# Patient Record
Sex: Male | Born: 1995 | Race: White | Hispanic: No | Marital: Single | Smoking: Current every day smoker
Health system: Southern US, Community
[De-identification: ages and names within clinical notes are randomized; demographics above are authoritative.]

## PROBLEM LIST (undated history)

## (undated) DIAGNOSIS — Z9889 Other specified postprocedural states: Secondary | ICD-10-CM

## (undated) DIAGNOSIS — R011 Cardiac murmur, unspecified: Secondary | ICD-10-CM

## (undated) HISTORY — PX: BACK SURGERY: SHX140

## (undated) HISTORY — DX: Cardiac murmur, unspecified: R01.1

---
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
## 2016-02-28 DIAGNOSIS — F17210 Nicotine dependence, cigarettes, uncomplicated: Secondary | ICD-10-CM | POA: Diagnosis not present

## 2016-02-28 DIAGNOSIS — A6002 Herpesviral infection of other male genital organs: Secondary | ICD-10-CM | POA: Insufficient documentation

## 2016-02-28 DIAGNOSIS — R21 Rash and other nonspecific skin eruption: Secondary | ICD-10-CM | POA: Diagnosis present

## 2016-02-29 ENCOUNTER — Encounter (HOSPITAL_COMMUNITY): Payer: Self-pay

## 2016-02-29 ENCOUNTER — Emergency Department (HOSPITAL_COMMUNITY)
Admission: EM | Admit: 2016-02-29 | Discharge: 2016-02-29 | Disposition: A | Discharge status: Home / Self Care | Payer: Commercial Managed Care - HMO | Attending: Emergency Medicine | Admitting: Emergency Medicine

## 2016-02-29 DIAGNOSIS — B009 Herpesviral infection, unspecified: Secondary | ICD-10-CM

## 2016-02-29 MED ORDER — DOXYCYCLINE HYCLATE 100 MG PO CAPS: 100.0000 mg | ORAL_CAPSULE | Freq: Two times a day (BID) | ORAL | Status: DC

## 2016-02-29 MED ORDER — IBUPROFEN 800 MG PO TABS
800.0000 mg | ORAL_TABLET | Freq: Once | ORAL | Status: AC
  Administered 2016-02-29: 800 mg via ORAL
  Filled 2016-02-29: qty 1

## 2016-02-29 MED ORDER — DOXYCYCLINE HYCLATE 100 MG PO TABS
100.0000 mg | ORAL_TABLET | Freq: Two times a day (BID) | ORAL | Status: DC
  Administered 2016-02-29: 100 mg via ORAL
  Filled 2016-02-29: qty 1

## 2016-02-29 MED ORDER — ONDANSETRON HCL 4 MG PO TABS
4.0000 mg | ORAL_TABLET | Freq: Once | ORAL | Status: AC
  Administered 2016-02-29: 4 mg via ORAL
  Filled 2016-02-29: qty 1

## 2016-02-29 MED ORDER — ACYCLOVIR 200 MG PO CAPS
800.0000 mg | ORAL_CAPSULE | Freq: Once | ORAL | Status: AC
  Administered 2016-02-29: 800 mg via ORAL
  Filled 2016-02-29 (×2): qty 4

## 2016-02-29 MED ORDER — ACYCLOVIR 400 MG PO TABS: 400.0000 mg | ORAL_TABLET | Freq: Three times a day (TID) | ORAL | Status: DC

## 2016-02-29 MED ORDER — ACETAMINOPHEN 325 MG PO TABS
650.0000 mg | ORAL_TABLET | Freq: Once | ORAL | Status: AC
  Administered 2016-02-29: 650 mg via ORAL
  Filled 2016-02-29: qty 2

## 2016-02-29 NOTE — Discharge Instructions (Signed)
Your rash may be related to the blister that you ruptured. Your rash may also be related to condoms, but most likely herpes. Please start acyclovir 3 times daily. Please use doxycycline 2 times daily for possible staph type infection. Use 600 mg of ibuprofen every 6 hours for soreness. May use Tylenol in between those doses if needed for discomfort. The result of your herpes titer should be available within the next 2-3 days. Please refrain from all sexual activity until you received the results of the tests. Herpes Simplex Virus Herpes simplex virus is a viral infection that may infect many different areas of the body, such as the genitalia and mouth. There are two different strains of the virus: herpes simplex virus 1 (HSV-1) and herpes simplex virus 2 (HSV-2). HSV-1 is typically associated with infections of the mouth and lips. HSV-2 is associated with infections of the genitals. However, either strain of the virus may infect any area. HSV may be spread through saliva particles or sexual contact. One unusual form of HSV-1, known as herpes gladiatorum, is passed from skin-to-skin contact, such as in wrestling. SYMPTOMS   Sometimes, no symptoms.  Fever.  Headache.  Muscle aches.  Tingling.  Itching.  Tenderness.  Genital burning feeling.  Genital pain.  Pain with urination.  Pain with sexual intercourse.  Small blisters in the affected areas. RISK FACTORS   Kissing an infected person.  Sharing eating utensils with an infected person.  Unprotected sexual activity.  Multiple sexual partners.  Direct contact sports without protective clothing.  Contact with an exposed herpes sore.  Stress, illness, and cold increase the risk of recurrence. PROGNOSIS  The primary outbreak of an HSV infection usually lasts 2 to 3 weeks. However, it has been known to last up to 6 weeks. After the primary outbreak subsides, the virus goes into a stage known as latency. During this time, there  may be no physical symptoms of infection. After a period of time, some event, such as stress, cold, or illness will trigger another outbreak. This cycle of latency and outbreak may continue indefinitely. The outbreaks usually become milder over time. The body cannot rid itself of HSV. RELATED COMPLICATIONS   Recurrence.  Infection in other areas of the body, such as the eye (ocular herpetic infection, keratitis) and rarely the brain (herpetic encephalitis). TREATMENT  Many HSV infections can be treated without medicine. During an outbreak, avoid touching the sores. Ice may be used to dull the pain and suppress the virus. Exposure to the sun is a common trigger for an outbreak, so the use of sunscreen may help in such cases. Avoid sexual contact during outbreaks. During the latent periods, it is advised that you use latex condoms, which will reduce the likelihood of spreading the virus to another person. Condoms made from animal products do not protect against HSV. Male condoms cover a larger area than male condoms, and may offer the most protection from the transmission of HSV. The presence of HSV will not affect a condom's ability to protect against pregnancy. Only take medicines for pain and discomfort if directed to do so by your caregiver. Many claims exist that certain dietary changes will prevent an outbreak, but these claims have not been proven. These claims include eating foods that are high in L-lysine and low in arginine (i.e. yogurt, beets, apples, pears, mangoes, oily fish (such as salmon, haddock, snapper, and swordfish), soybean sprouts, chicken, and tomatoes).  Athletes may return to play once they are showing no  symptoms, and they have been treated.    This information is not intended to replace advice given to you by your health care provider. Make sure you discuss any questions you have with your health care provider.   Document Released: 11/10/2005 Document Revised: 02/02/2012  Document Reviewed: 05/30/2015 Elsevier Interactive Patient Education [employer]

## 2016-02-29 NOTE — ED Notes (Signed)
Patient verbalizes understanding of discharge instructions, prescription medications, home care and follow up care. Patient out of department at this time. 

## 2016-02-29 NOTE — ED Notes (Signed)
Patient states rash on scrotum X3 days. Patient [facility]. Patient states he has had multiple sex partners, and thinks he may have herpes.

## 2016-02-29 NOTE — ED Provider Notes (Signed)
CSN: [id number]     Arrival date & time 02/28/16  2359 History   First MD Initiated Contact with Patient 02/29/16 0015     Chief Complaint  Patient presents with  . Rash     (Consider location/radiation/quality/duration/timing/severity/associated sxs/prior Treatment) HPI Comments: Patient is an 20 year old male who presents to the emergency department with complaint of rash on the penis and scrotum.  The patient states that he was in a relationship with his current girlfriend, this relationship ended and he started having sexual intercourse with other women, he then came back to be with the current girlfriend and only use protection one time. Shortly after this he noticed a blister type bump on the scrotum. He states that he ruptured it, and after this he began to notice increasing bumps on the penis and the scrotum. He states that the areas are painful and seemed to keep increasing in volume. He has not had any high fever. He's not had any drainage or discharge from the penis. He is not had a change in the type of condom used, and he has no known history of allergy to latex condoms. The patient states he also has a "knot" on the inguinal area that is tender.  It is of note that the patient was seen at the [facility] hospital 2 days ago at which time he was treated with Zithromax and Rocephin, but it the patient states the problems and to be getting worse instead of better.  The history is provided by the patient.    History reviewed. No pertinent past medical history. Past Surgical History  Procedure Laterality Date  . Back surgery     History reviewed. No pertinent family history. Social History  Substance Use Topics  . Smoking status: Current Every Day Smoker -- 0.25 packs/day    Types: Cigarettes  . Smokeless tobacco: Current User    Types: Chew  . Alcohol Use: Yes     Comment: occasionally    Review of Systems  Constitutional: Negative for fever, chills, activity change and  appetite change.       All ROS Neg except as noted in HPI  HENT: Negative for nosebleeds.   Eyes: Negative for photophobia and discharge.  Respiratory: Negative for cough, shortness of breath and wheezing.   Cardiovascular: Negative for chest pain and palpitations.  Gastrointestinal: Negative for abdominal pain and blood in stool.  Genitourinary: Positive for penile pain. Negative for dysuria, frequency and hematuria.  Musculoskeletal: Negative for back pain, arthralgias and neck pain.  Skin: Positive for rash.  Neurological: Negative for dizziness, seizures and speech difficulty.  Psychiatric/Behavioral: Negative for hallucinations and confusion.      Allergies  Review of patient's allergies indicates no known allergies.  Home Medications   Prior to Admission medications   Medication Sig Start Date End Date Taking? Authorizing Provider  ciprofloxacin (CIPRO) 500 MG tablet Take 1 tablet (500 mg total) by mouth 2 (two) times daily. 10/12/15   Henderson Cloud, MD  metroNIDAZOLE (FLAGYL) 500 MG tablet Take 1 tablet (500 mg total) by mouth 3 (three) times daily. 10/12/15   Henderson Cloud, MD  VYVANSE 50 MG capsule Take 50 mg by mouth daily. 10/05/14   Historical Provider, MD   BP 138/86 mmHg  Pulse 94  Temp(Src) 97.9 F (36.6 C) (Oral)  Resp 18  Ht  (1.753 m)  Wt 99.791 kg  BMI 32.47 kg/m2  SpO2 100% Physical Exam  Constitutional: He is oriented to  person, place, and time. He appears well-developed and well-nourished.  Non-toxic appearance.  HENT:  Head: Normocephalic.  Right Ear: Tympanic membrane and external ear normal.  Left Ear: Tympanic membrane and external ear normal.  Eyes: EOM and lids are normal. Pupils are equal, round, and reactive to light.  Neck: Normal range of motion. Neck supple. Carotid bruit is not present.  Cardiovascular: Normal rate, regular rhythm, normal heart sounds, intact distal pulses and normal pulses.   Pulmonary/Chest:  Breath sounds normal. No respiratory distress.  Abdominal: Soft. Bowel sounds are normal. There is no tenderness. There is no guarding.  Genitourinary: Right testis shows no swelling. Left testis shows no swelling. Circumcised. Penile tenderness present. No discharge found.  Patient has multiple lesions about the head of the penis and the underside of the penis that have a red base and blister some of the blisters have ruptured. There are also a few red raised areas on the scrotum. The scrotal area and the penis area are tender to palpation or manipulation. There are palpable small nodes in the inguinal areas on the right and the left. There is no penile drainage or discharge appreciated.  Musculoskeletal: Normal range of motion.  Lymphadenopathy:       Head (right side): No submandibular adenopathy present.       Head (left side): No submandibular adenopathy present.    He has no cervical adenopathy.       Right: Inguinal adenopathy present.       Left: Inguinal adenopathy present.  Neurological: He is alert and oriented to person, place, and time. He has normal strength. No cranial nerve deficit or sensory deficit.  Skin: Skin is warm and dry.  Psychiatric: He has a normal mood and affect. His speech is normal.  Nursing note and vitals reviewed.   ED Course  Procedures (including critical care time) Labs Review Labs Reviewed - No data to display  Imaging Review No results found. I have personally reviewed and evaluated these images and lab results as part of my medical decision-making.   EKG Interpretation None      MDM  Vital signs within normal limits. Discussed with the patient that his rash could be related to the blister that he ruptured, condoms, and or herpes. Will start acyclovir and doxycycline. Discussed with the patient the contagious nature of this illness. Herpes titer drawn. Patient is to follow with his primary physician.    Final diagnoses:  None    *I have  reviewed nursing notes, vital signs, and all appropriate lab and imaging results for this patient.[address]    Mairyn Lenahan, PA-C 02/29/16 [id number]  Rolland PorterMark James, MD 02/29/16 [phone]

## 2016-03-01 LAB — HSV(HERPES SIMPLEX VRS) I + II AB-IGG

## 2018-01-04 ENCOUNTER — Encounter (HOSPITAL_COMMUNITY): Payer: Self-pay | Admitting: Emergency Medicine

## 2018-01-04 ENCOUNTER — Emergency Department (HOSPITAL_COMMUNITY): Payer: 59

## 2018-01-04 ENCOUNTER — Emergency Department (HOSPITAL_COMMUNITY)
Admission: EM | Admit: 2018-01-04 | Discharge: 2018-01-05 | Disposition: A | Discharge status: Home / Self Care | Payer: 59 | Attending: Emergency Medicine | Admitting: Emergency Medicine

## 2018-01-04 ENCOUNTER — Other Ambulatory Visit: Payer: Self-pay

## 2018-01-04 DIAGNOSIS — Z79899 Other long term (current) drug therapy: Secondary | ICD-10-CM | POA: Diagnosis not present

## 2018-01-04 DIAGNOSIS — F17210 Nicotine dependence, cigarettes, uncomplicated: Secondary | ICD-10-CM | POA: Diagnosis not present

## 2018-01-04 DIAGNOSIS — M5432 Sciatica, left side: Secondary | ICD-10-CM | POA: Insufficient documentation

## 2018-01-04 DIAGNOSIS — M5431 Sciatica, right side: Secondary | ICD-10-CM | POA: Diagnosis not present

## 2018-01-04 DIAGNOSIS — M545 Low back pain: Secondary | ICD-10-CM | POA: Diagnosis present

## 2018-01-04 MED ORDER — KETOROLAC TROMETHAMINE 30 MG/ML IJ SOLN
30.0000 mg | Freq: Once | INTRAMUSCULAR | Status: AC
  Administered 2018-01-04: 30 mg via INTRAMUSCULAR
  Filled 2018-01-04: qty 1

## 2018-01-04 MED ORDER — HYDROCODONE-ACETAMINOPHEN 5-325 MG PO TABS
1.0000 | ORAL_TABLET | Freq: Once | ORAL | Status: AC
  Administered 2018-01-04: 1 via ORAL
  Filled 2018-01-04: qty 1

## 2018-01-04 MED ORDER — PREDNISONE 10 MG PO TABS
60.0000 mg | ORAL_TABLET | Freq: Once | ORAL | Status: AC
  Administered 2018-01-04: 60 mg via ORAL
  Filled 2018-01-04: qty 1

## 2018-01-04 NOTE — ED Triage Notes (Signed)
Pt c/o lower back pain that radiates to bilateral buttocks.

## 2018-01-04 NOTE — ED Provider Notes (Signed)
[facility] EMERGENCY DEPARTMENT Provider Note   CSN: [id number] Arrival date & time: 01/04/18  2026     History   Chief Complaint Chief Complaint  Patient presents with  . Back Pain    HPI Gabriel Chapman is a 22 y.o. male who is currently being treated for acute prostatitis with ciprofloxacin presenting with low back pain which started yesterday with a burning sensation radiating to his bilateral mid buttock region. 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  He denies fevers, chills, dysuria, hematuria and has had no urinary or fecal incontinence or retention.  His pain is worsened with certain positions.  He has had no medicine or treatment prior to arrival.  The history is provided by the patient.    History reviewed. No pertinent past medical history.  Patient Active Problem List   Diagnosis Date Noted  . Nausea and vomiting 10/11/2015  . Enteritis 10/11/2015  . ETOH abuse 10/11/2015  . Tobacco abuse 10/11/2015  . Leukocytosis 10/11/2015    Past Surgical History:  Procedure Laterality Date  . BACK SURGERY         Home Medications    Prior to Admission medications   Medication Sig Start Date End Date Taking? Authorizing Provider  acyclovir (ZOVIRAX) 400 MG tablet Take 1 tablet (400 mg total) by mouth 3 (three) times daily. 02/29/16   Ivery Quale, PA-C  ciprofloxacin (CIPRO) 500 MG tablet Take 1 tablet (500 mg total) by mouth 2 (two) times daily. 10/12/15   Philip Aspen, Limmie Patricia, MD  cyclobenzaprine (FLEXERIL) 5 MG tablet Take 1 tablet (5 mg total) by mouth 3 (three) times daily as needed for muscle spasms. 01/05/18   Burgess Amor, PA-C  doxycycline (VIBRAMYCIN) 100 MG capsule Take 1 capsule (100 mg total) by mouth 2 (two) times daily. 02/29/16   Ivery Quale, PA-C  HYDROcodone-acetaminophen (NORCO/VICODIN) 5-325 MG tablet Take 1 tablet by  mouth every 4 (four) hours as needed. 01/05/18   Burgess Amor, PA-C  metroNIDAZOLE (FLAGYL) 500 MG tablet Take 1 tablet (500 mg total) by mouth 3 (three) times daily. 10/12/15   Philip Aspen, Limmie Patricia, MD  predniSONE (DELTASONE) 10 MG tablet Take 6 tablets day one, 5 tablets day two, 4 tablets day three, 3 tablets day four, 2 tablets day five, then 1 tablet day six 01/05/18   Caliph Borowiak, Raynelle Fanning, PA-C  VYVANSE 50 MG capsule Take 50 mg by mouth daily. 10/05/14   [provider]    Family History No family history on file.  Social History Social History   Tobacco Use  . Smoking status: Current Every Day Smoker    Packs/day: 0.25    Types: Cigarettes  . Smokeless tobacco: Current User    Types: Chew  Substance Use Topics  . Alcohol use: Yes    Comment: occasionally  . Drug use: No     Allergies   Patient has no known allergies.   Review of Systems Review of Systems  Constitutional: Negative for fever.  Respiratory: Negative for shortness of breath.   Cardiovascular: Negative for chest pain and leg swelling.  Gastrointestinal: Negative for abdominal distention, abdominal pain and constipation.  Genitourinary: Negative for difficulty urinating, dysuria, flank pain, frequency and urgency.  Musculoskeletal: Positive for back pain. Negative for gait problem and joint swelling.  Skin: Negative for rash.  Neurological: Negative for weakness and numbness.  Physical Exam Updated Vital Signs BP (!) 148/91 (BP Location: Right Arm)   Pulse (!) 103   Temp 98.4 F (36.9 C) (Oral)   Resp 20   Ht 5' 7.5" (1.715 m)   Wt 105.7 kg (233 lb)   SpO2 100%   BMI 35.95 kg/m   Physical Exam  Constitutional: He appears well-developed and well-nourished.  HENT:  Head: Normocephalic.  Eyes: Conjunctivae are normal.  Neck: Normal range of motion. Neck supple.  Cardiovascular: Normal rate and intact distal pulses.  Pedal pulses normal.  Pulmonary/Chest: Effort normal.  Abdominal:  Soft. Bowel sounds are normal. He exhibits no distension and no mass.  Musculoskeletal: Normal range of motion. He exhibits no edema.       Lumbar back: He exhibits bony tenderness. He exhibits no swelling, no edema and no spasm.  Midline lumbar ttp. No palpable deformity, no edema, no erythema.  Neurological: He is alert. He has normal strength. He displays no atrophy and no tremor. No sensory deficit. Gait normal.  Reflex Scores:      Patellar reflexes are 2+ on the right side and 2+ on the left side.      Achilles reflexes are 2+ on the right side and 2+ on the left side. No strength deficit noted in hip and knee flexor and extensor muscle groups.  Ankle flexion and extension intact.  Skin: Skin is warm and dry.  Psychiatric: He has a normal mood and affect.  Nursing note and vitals reviewed.    ED Treatments / Results  Labs (all labs ordered are listed, but only abnormal results are displayed) Labs Reviewed - No data to display  EKG  EKG Interpretation None       Radiology Dg Lumbar Spine Complete  Result Date: 01/04/2018 CLINICAL DATA:  22 y/o M; lower back pain radiating down both legs since yesterday. EXAM: LUMBAR SPINE - COMPLETE 4+ VIEW COMPARISON:  None. FINDINGS: There is no evidence of lumbar spine fracture. Minimal S-shaped curvature of the lumbar spine. Normal lumbar lordosis without listhesis. Intervertebral disc spaces are maintained. IMPRESSION: Minimal S-shaped curvature. Normal lumbar lordosis without listhesis. No acute fracture. Electronically Signed   By: Mitzi Hansen M.D.   On: 01/04/2018 23:26    Procedures Procedures (including critical care time)  Medications Ordered in ED Medications  ketorolac (TORADOL) 30 MG/ML injection 30 mg (30 mg Intramuscular Given 01/04/18 2238)  predniSONE (DELTASONE) tablet 60 mg (60 mg Oral Given 01/04/18 2238)  HYDROcodone-acetaminophen (NORCO/VICODIN) 5-325 MG per tablet 1 tablet (1 tablet Oral Given 01/04/18  2238)     Initial Impression / Assessment and Plan / ED Course  I have reviewed the triage vital signs and the nursing notes.  Pertinent labs & imaging results that were available during my care of the patient were reviewed by me and considered in my medical decision making (see chart for details).     No neuro deficit on exam or by history to suggest emergent or surgical presentation.  Discussed worsened sx that should prompt immediate re-evaluation including distal weakness, bowel/bladder retention/incontinence.        Final Clinical Impressions(s) / ED Diagnoses   Final diagnoses:  Bilateral sciatica    ED Discharge Orders        Ordered    predniSONE (DELTASONE) 10 MG tablet     01/05/18 0005    HYDROcodone-acetaminophen (NORCO/VICODIN) 5-325 MG tablet  Every 4 hours PRN     01/05/18 0005    cyclobenzaprine (FLEXERIL) 5 MG  tablet  3 times daily PRN     01/05/18 0005       Burgess Amordol, Mackenzy Eisenberg, PA-C 01/05/18 0008    Loren RacerYelverton, David, MD 01/05/18 2234

## 2018-01-05 MED ORDER — HYDROCODONE-ACETAMINOPHEN 5-325 MG PO TABS: 1.0000 | ORAL_TABLET | ORAL | 0 refills | Status: DC | PRN

## 2018-01-05 MED ORDER — PREDNISONE 10 MG PO TABS: ORAL_TABLET | ORAL | 0 refills | Status: DC

## 2018-01-05 MED ORDER — CYCLOBENZAPRINE HCL 5 MG PO TABS: 5.0000 mg | ORAL_TABLET | Freq: Three times a day (TID) | ORAL | 0 refills | Status: DC | PRN

## 2018-01-05 NOTE — Discharge Instructions (Signed)
Take your next dose of prednisone tomorrow evening.  Use the the other medicines as directed.  Do not drive within 4 hours of taking hydrocodone as this will make you drowsy.  Avoid lifting,  Bending,  Twisting or any other activity that worsens your pain over the next week.  Apply a heating pad to your lower back for 20 minutes several times daily.   You should get rechecked if your symptoms are not better over the next 5 days,  Or you develop increased pain,  Weakness in your leg(s) or loss of bladder or bowel function - these can be symptoms of a worsening condition as discussed.

## 2018-01-21 ENCOUNTER — Encounter: Payer: Self-pay | Admitting: Cardiology

## 2018-01-21 ENCOUNTER — Telehealth: Payer: Self-pay | Admitting: Cardiology

## 2018-01-21 ENCOUNTER — Ambulatory Visit: Payer: 59 | Admitting: Cardiology

## 2018-01-21 ENCOUNTER — Other Ambulatory Visit: Payer: Self-pay

## 2018-01-21 VITALS — BP 127/75 | HR 78 | Ht 67.5 in | Wt 235.0 lb

## 2018-01-21 DIAGNOSIS — R011 Cardiac murmur, unspecified: Secondary | ICD-10-CM

## 2018-01-21 DIAGNOSIS — R002 Palpitations: Secondary | ICD-10-CM | POA: Diagnosis not present

## 2018-01-21 NOTE — Progress Notes (Addendum)
Clinical Summary Gabriel Chapman is a 21 y.o.male seen as new consult, referred by Dr Beverely Pace for hear tmurmur  1. Heart murmur - recently detected by pcp - no SOB/DOE. Very active in high school sports growing up without limitation - no recent edema. No chest pain.   2. Irregular heart beat - palpitations, mainly feels pounding in neck area. Occurs few times a week. Lasts a few seconds. No other associated symptoms - rare coffee, diet pepsi x2-3, sweet tea occasionally, no energy drinks, occasoinal beer  Denies any past medical history   No Known Allergies   Current Outpatient Medications  Medication Sig Dispense Refill  . acyclovir (ZOVIRAX) 400 MG tablet Take 1 tablet (400 mg total) by mouth 3 (three) times daily. 30 tablet 0  . ciprofloxacin (CIPRO) 500 MG tablet Take 1 tablet (500 mg total) by mouth 2 (two) times daily. 28 tablet 0  . cyclobenzaprine (FLEXERIL) 5 MG tablet Take 1 tablet (5 mg total) by mouth 3 (three) times daily as needed for muscle spasms. 15 tablet 0  . doxycycline (VIBRAMYCIN) 100 MG capsule Take 1 capsule (100 mg total) by mouth 2 (two) times daily. 14 capsule 0  . HYDROcodone-acetaminophen (NORCO/VICODIN) 5-325 MG tablet Take 1 tablet by mouth every 4 (four) hours as needed. 10 tablet 0  . metroNIDAZOLE (FLAGYL) 500 MG tablet Take 1 tablet (500 mg total) by mouth 3 (three) times daily. 42 tablet 0  . predniSONE (DELTASONE) 10 MG tablet Take 6 tablets day one, 5 tablets day two, 4 tablets day three, 3 tablets day four, 2 tablets day five, then 1 tablet day six 21 tablet 0  . VYVANSE 50 MG capsule Take 50 mg by mouth daily.     No current facility-administered medications for this visit.      Past Surgical History:  Procedure Laterality Date  . BACK SURGERY       No Known Allergies    No family history on file.   Social History Gabriel Chapman reports that he has been smoking cigarettes.  He has been smoking about 0.25 packs per day. His  smokeless tobacco use includes chew. Gabriel Chapman reports that he drinks alcohol.   Review of Systems CONSTITUTIONAL: No weight loss, fever, chills, weakness or fatigue.  HEENT: Eyes: No visual loss, blurred vision, double vision or yellow sclerae.No hearing loss, sneezing, congestion, runny nose or sore throat.  SKIN: No rash or itching.  CARDIOVASCULAR: per hpi RESPIRATORY: No shortness of breath, cough or sputum.  GASTROINTESTINAL: No anorexia, nausea, vomiting or diarrhea. No abdominal pain or blood.  GENITOURINARY: No burning on urination, no polyuria NEUROLOGICAL: No headache, dizziness, syncope, paralysis, ataxia, numbness or tingling in the extremities. No change in bowel or bladder control.  MUSCULOSKELETAL: No muscle, back pain, joint pain or stiffness.  LYMPHATICS: No enlarged nodes. No history of splenectomy.  PSYCHIATRIC: No history of depression or anxiety.  ENDOCRINOLOGIC: No reports of sweating, cold or heat intolerance. No polyuria or polydipsia.  Marland Kitchen   Physical Examination Vitals:   01/21/18 0841  BP: 127/75  Pulse: 78  SpO2: 99%   Vitals:   01/21/18 0841  Weight: 235 lb (106.6 kg)  Height: 5' 7.5" (1.715 m)    Gen: resting comfortably, no acute distress HEENT: no scleral icterus, pupils equal round and reactive, no palptable cervical adenopathy,  CV: RRR, 2/6 systolic murmur rusb, no jvd Resp: Clear to auscultation bilaterally GI: abdomen is soft, non-tender, non-distended, normal bowel sounds, no hepatosplenomegaly  MSK: extremities are warm, no edema.  Skin: warm, no rash Neuro:  no focal deficits Psych: appropriate affect   Assessment and Plan  1. Heart murmur - obtain echo to further evaluate  2. Palpitations EKG today shows SR. Prior EKGs have shown occasional PVCs - wean caffeine and monitor symptoms, f/u echo  F/u pending test results   Antoine PocheJonathan F. Russie Gulledge, M.D.

## 2018-01-21 NOTE — Patient Instructions (Signed)

## 2018-01-21 NOTE — Telephone Encounter (Signed)
Pre-cert Verification for the following procedure   Echo scheduled for 02-03-2018

## 2018-01-26 ENCOUNTER — Telehealth: Payer: Self-pay | Admitting: *Deleted

## 2018-01-26 ENCOUNTER — Encounter: Payer: Self-pay | Admitting: Cardiology

## 2018-01-26 NOTE — Telephone Encounter (Signed)
Pt is scheduled to start a new job next week and they will not allow him to start w/o clearance from Dr Wyline MoodBranch - pt is scheduled for echo on 3/13 - can we clear him now or wait until echo is resulted?

## 2018-01-27 ENCOUNTER — Encounter: Payer: Self-pay | Admitting: *Deleted

## 2018-01-27 NOTE — Telephone Encounter (Signed)
He is ok to start work. There is nothing from a cardiac standpoint we are worried about that would keep him from working   Dominga FerryJ Saranda Legrande MD

## 2018-01-27 NOTE — Telephone Encounter (Signed)
Pt aware and appreciative. [facility].

## 2018-02-03 ENCOUNTER — Other Ambulatory Visit: Payer: 59

## 2018-02-03 DIAGNOSIS — R0989 Other specified symptoms and signs involving the circulatory and respiratory systems: Secondary | ICD-10-CM

## 2018-03-03 ENCOUNTER — Encounter: Payer: Self-pay | Admitting: Cardiology

## 2018-09-04 ENCOUNTER — Emergency Department (HOSPITAL_COMMUNITY)
Admission: EM | Admit: 2018-09-04 | Discharge: 2018-09-04 | Disposition: A | Discharge status: Home / Self Care | Payer: 59 | Attending: Emergency Medicine | Admitting: Emergency Medicine

## 2018-09-04 ENCOUNTER — Encounter (HOSPITAL_COMMUNITY): Payer: Self-pay | Admitting: Emergency Medicine

## 2018-09-04 ENCOUNTER — Other Ambulatory Visit: Payer: Self-pay

## 2018-09-04 DIAGNOSIS — K529 Noninfective gastroenteritis and colitis, unspecified: Secondary | ICD-10-CM | POA: Insufficient documentation

## 2018-09-04 DIAGNOSIS — F17220 Nicotine dependence, chewing tobacco, uncomplicated: Secondary | ICD-10-CM | POA: Insufficient documentation

## 2018-09-04 DIAGNOSIS — R112 Nausea with vomiting, unspecified: Secondary | ICD-10-CM | POA: Diagnosis present

## 2018-09-04 DIAGNOSIS — F17210 Nicotine dependence, cigarettes, uncomplicated: Secondary | ICD-10-CM | POA: Insufficient documentation

## 2018-09-04 LAB — URINALYSIS, ROUTINE W REFLEX MICROSCOPIC
BILIRUBIN URINE: NEGATIVE
Bacteria, UA: NONE SEEN
Glucose, UA: NEGATIVE mg/dL
Hgb urine dipstick: NEGATIVE
Ketones, ur: NEGATIVE mg/dL
LEUKOCYTES UA: NEGATIVE
NITRITE: NEGATIVE
PH: 6 (ref 5.0–8.0)
Protein, ur: 30 mg/dL — AB
Specific Gravity, Urine: 1.021 (ref 1.005–1.030)

## 2018-09-04 LAB — CBC
HCT: 49 % (ref 39.0–52.0)
Hemoglobin: 17.1 g/dL — ABNORMAL HIGH (ref 13.0–17.0)
MCH: 32.1 pg (ref 26.0–34.0)
MCHC: 34.9 g/dL (ref 30.0–36.0)
MCV: 92.1 fL (ref 80.0–100.0)
NRBC: 0 % (ref 0.0–0.2)
Platelets: 272 10*3/uL (ref 150–400)
RBC: 5.32 MIL/uL (ref 4.22–5.81)
RDW: 11.9 % (ref 11.5–15.5)
WBC: 15.1 10*3/uL — AB (ref 4.0–10.5)

## 2018-09-04 LAB — COMPREHENSIVE METABOLIC PANEL
ALK PHOS: 67 U/L (ref 38–126)
ALT: 40 U/L (ref 0–44)
AST: 25 U/L (ref 15–41)
Albumin: 5.6 g/dL — ABNORMAL HIGH (ref 3.5–5.0)
Anion gap: 11 (ref 5–15)
BUN: 10 mg/dL (ref 6–20)
CALCIUM: 9.8 mg/dL (ref 8.9–10.3)
CO2: 22 mmol/L (ref 22–32)
CREATININE: 0.66 mg/dL (ref 0.61–1.24)
Chloride: 106 mmol/L (ref 98–111)
Glucose, Bld: 111 mg/dL — ABNORMAL HIGH (ref 70–99)
Potassium: 3.9 mmol/L (ref 3.5–5.1)
Sodium: 139 mmol/L (ref 135–145)
Total Bilirubin: 1.1 mg/dL (ref 0.3–1.2)
Total Protein: 8.6 g/dL — ABNORMAL HIGH (ref 6.5–8.1)

## 2018-09-04 LAB — LIPASE, BLOOD: Lipase: 22 U/L (ref 11–51)

## 2018-09-04 MED ORDER — ONDANSETRON HCL 8 MG PO TABS: 8.0000 mg | ORAL_TABLET | ORAL | 0 refills | Status: DC | PRN

## 2018-09-04 MED ORDER — ACETAMINOPHEN 500 MG PO TABS
1000.0000 mg | ORAL_TABLET | Freq: Once | ORAL | Status: AC
  Administered 2018-09-04: 1000 mg via ORAL
  Filled 2018-09-04: qty 2

## 2018-09-04 MED ORDER — SODIUM CHLORIDE 0.9 % IV BOLUS
1000.0000 mL | Freq: Once | INTRAVENOUS | Status: AC
  Administered 2018-09-04: 1000 mL via INTRAVENOUS

## 2018-09-04 MED ORDER — FAMOTIDINE IN NACL 20-0.9 MG/50ML-% IV SOLN
20.0000 mg | Freq: Once | INTRAVENOUS | Status: AC
  Administered 2018-09-04: 20 mg via INTRAVENOUS
  Filled 2018-09-04: qty 50

## 2018-09-04 MED ORDER — ONDANSETRON HCL 4 MG/2ML IJ SOLN
4.0000 mg | Freq: Once | INTRAMUSCULAR | Status: AC
  Administered 2018-09-04: 4 mg via INTRAVENOUS
  Filled 2018-09-04: qty 2

## 2018-09-04 NOTE — Discharge Instructions (Addendum)
Prescription for nausea medication.  Tylenol for headache.  Clear liquids today.  Rest.  Return if worse.

## 2018-09-04 NOTE — ED Triage Notes (Signed)
Emesis since last night with small episodes of D/. No OTC medications. No sick contacts.

## 2018-09-04 NOTE — ED Provider Notes (Signed)
[facility] EMERGENCY DEPARTMENT Provider Note   CSN: [id number] Arrival date & time: 09/04/18  1055     History   Chief Complaint Chief Complaint  Patient presents with  . Emesis    HPI Gabriel Chapman is a 22 y.o. male.  Multiple episodes of nausea and vomiting with some diarrhea since midnight.  Patient feels dehydrated.  He is normally healthy.  He states "2 beers" last night.  No fever or chills or bloody stool.  Severity of symptoms is moderate.  Nothing makes symptoms better or worse.     Past Medical History:  Diagnosis Date  . Heart murmur     Patient Active Problem List   Diagnosis Date Noted  . Nausea and vomiting 10/11/2015  . Enteritis 10/11/2015  . ETOH abuse 10/11/2015  . Tobacco abuse 10/11/2015  . Leukocytosis 10/11/2015    Past Surgical History:  Procedure Laterality Date  . BACK SURGERY          Home Medications    Prior to Admission medications   Medication Sig Start Date End Date Taking? Authorizing Provider  cyclobenzaprine (FLEXERIL) 5 MG tablet Take 1 tablet (5 mg total) by mouth 3 (three) times daily as needed for muscle spasms. 01/05/18   Burgess Amor, PA-C  ondansetron (ZOFRAN) 8 MG tablet Take 1 tablet (8 mg total) by mouth every 4 (four) hours as needed. 09/04/18   Donnetta Hutching, MD  predniSONE (DELTASONE) 10 MG tablet Take 6 tablets day one, 5 tablets day two, 4 tablets day three, 3 tablets day four, 2 tablets day five, then 1 tablet day six 01/05/18   Burgess Amor, PA-C    Family History History reviewed. No pertinent family history.  Social History Social History   Tobacco Use  . Smoking status: Current Every Day Smoker    Packs/day: 0.25    Types: Cigarettes  . Smokeless tobacco: Current User    Types: Chew  Substance Use Topics  . Alcohol use: Yes    Comment: occasionally  . Drug use: No     Allergies   Patient has no known allergies.   Review of Systems Review of Systems  All other systems reviewed and are  negative.    Physical Exam Updated Vital Signs BP 125/75 (BP Location: Left Arm)   Pulse 94   Temp 98.2 F (36.8 C) (Oral)   Resp 16   Ht 5\' 8"  (1.727 m)   Wt 111.1 kg   SpO2 97%   BMI 37.25 kg/m   Physical Exam  Constitutional: He is oriented to person, place, and time.  Dry mucous membranes.  HENT:  Head: Normocephalic and atraumatic.  Eyes: Conjunctivae are normal.  Neck: Neck supple.  Cardiovascular: Normal rate and regular rhythm.  Pulmonary/Chest: Effort normal and breath sounds normal.  Abdominal: Soft. Bowel sounds are normal.  Musculoskeletal: Normal range of motion.  Neurological: He is alert and oriented to person, place, and time.  Skin: Skin is warm and dry.  Psychiatric: He has a normal mood and affect. His behavior is normal.  Nursing note and vitals reviewed.    ED Treatments / Results  Labs (all labs ordered are listed, but only abnormal results are displayed) Labs Reviewed  COMPREHENSIVE METABOLIC PANEL - Abnormal; Notable for the following components:      Result Value   Glucose, Bld 111 (*)    Total Protein 8.6 (*)    Albumin 5.6 (*)    All other components within normal limits  CBC - Abnormal; Notable for the following components:   WBC 15.1 (*)    Hemoglobin 17.1 (*)    All other components within normal limits  URINALYSIS, ROUTINE W REFLEX MICROSCOPIC - Abnormal; Notable for the following components:   Protein, ur 30 (*)    All other components within normal limits  LIPASE, BLOOD    EKG None  Radiology No results found.  Procedures Procedures (including critical care time)  Medications Ordered in ED Medications  sodium chloride 0.9 % bolus 1,000 mL ( Intravenous Stopped 09/04/18 1347)  ondansetron (ZOFRAN) injection 4 mg (4 mg Intravenous Given 09/04/18 1156)  famotidine (PEPCID) IVPB 20 mg premix ( Intravenous Stopped 09/04/18 1225)  sodium chloride 0.9 % bolus 1,000 mL ( Intravenous Stopped 09/04/18 1307)  acetaminophen  (TYLENOL) tablet 1,000 mg (1,000 mg Oral Given 09/04/18 1347)     Initial Impression / Assessment and Plan / ED Course  I have reviewed the triage vital signs and the nursing notes.  Pertinent labs & imaging results that were available during my care of the patient were reviewed by me and considered in my medical decision making (see chart for details).     History and physical most consistent with gastroenteritis.  White count minimally elevated, but liver functions and lipase are normal.  Patient feels much better after IV fluids, IV Zofran, IV Pepcid.  Discharge medications Zofran 8 mg  Final Clinical Impressions(s) / ED Diagnoses   Final diagnoses:  Gastroenteritis    ED Discharge Orders         Ordered    ondansetron (ZOFRAN) 8 MG tablet  Every 4 hours PRN,   Status:  Discontinued     09/04/18 1350    ondansetron (ZOFRAN) 8 MG tablet  Every 4 hours PRN     09/04/18 1351           Donnetta Hutching, MD 09/04/18 1514

## 2018-09-04 NOTE — ED Notes (Signed)
EDP at bedside  

## 2018-09-16 DIAGNOSIS — K219 Gastro-esophageal reflux disease without esophagitis: Secondary | ICD-10-CM | POA: Diagnosis not present

## 2018-10-25 ENCOUNTER — Emergency Department (HOSPITAL_COMMUNITY)
Admission: EM | Admit: 2018-10-25 | Discharge: 2018-10-25 | Disposition: A | Discharge status: Home / Self Care | Payer: 59 | Attending: Emergency Medicine | Admitting: Emergency Medicine

## 2018-10-25 ENCOUNTER — Other Ambulatory Visit: Payer: Self-pay

## 2018-10-25 ENCOUNTER — Encounter (HOSPITAL_COMMUNITY): Payer: Self-pay

## 2018-10-25 DIAGNOSIS — F17210 Nicotine dependence, cigarettes, uncomplicated: Secondary | ICD-10-CM | POA: Diagnosis not present

## 2018-10-25 DIAGNOSIS — F1410 Cocaine abuse, uncomplicated: Secondary | ICD-10-CM | POA: Diagnosis not present

## 2018-10-25 DIAGNOSIS — F322 Major depressive disorder, single episode, severe without psychotic features: Secondary | ICD-10-CM | POA: Insufficient documentation

## 2018-10-25 DIAGNOSIS — R45851 Suicidal ideations: Secondary | ICD-10-CM | POA: Insufficient documentation

## 2018-10-25 DIAGNOSIS — F1210 Cannabis abuse, uncomplicated: Secondary | ICD-10-CM | POA: Insufficient documentation

## 2018-10-25 DIAGNOSIS — F32A Depression, unspecified: Secondary | ICD-10-CM

## 2018-10-25 DIAGNOSIS — Z79899 Other long term (current) drug therapy: Secondary | ICD-10-CM | POA: Diagnosis not present

## 2018-10-25 DIAGNOSIS — F329 Major depressive disorder, single episode, unspecified: Secondary | ICD-10-CM | POA: Diagnosis present

## 2018-10-25 LAB — RAPID URINE DRUG SCREEN, HOSP PERFORMED
AMPHETAMINES: NOT DETECTED
BARBITURATES: NOT DETECTED
BENZODIAZEPINES: POSITIVE — AB
Cocaine: POSITIVE — AB
Opiates: NOT DETECTED
TETRAHYDROCANNABINOL: POSITIVE — AB

## 2018-10-25 LAB — COMPREHENSIVE METABOLIC PANEL
ALT: 29 U/L (ref 0–44)
ANION GAP: 9 (ref 5–15)
AST: 16 U/L (ref 15–41)
Albumin: 4.7 g/dL (ref 3.5–5.0)
Alkaline Phosphatase: 71 U/L (ref 38–126)
BUN: 11 mg/dL (ref 6–20)
CHLORIDE: 109 mmol/L (ref 98–111)
CO2: 23 mmol/L (ref 22–32)
Calcium: 9.7 mg/dL (ref 8.9–10.3)
Creatinine, Ser: 0.73 mg/dL (ref 0.61–1.24)
GFR calc Af Amer: >60 mL/min (ref >60)
Glucose, Bld: 132 mg/dL — ABNORMAL HIGH (ref 70–99)
POTASSIUM: 3.6 mmol/L (ref 3.5–5.1)
SODIUM: 141 mmol/L (ref 135–145)
Total Bilirubin: 0.5 mg/dL (ref 0.3–1.2)
Total Protein: 7.5 g/dL (ref 6.5–8.1)

## 2018-10-25 LAB — CBC WITH DIFFERENTIAL/PLATELET
ABS IMMATURE GRANULOCYTES: 0.04 10*3/uL (ref 0.00–0.07)
BASOS PCT: 1 %
Basophils Absolute: 0.1 10*3/uL (ref 0.0–0.1)
Eosinophils Absolute: 0.6 10*3/uL — ABNORMAL HIGH (ref 0.0–0.5)
Eosinophils Relative: 5 %
HCT: 46.2 % (ref 39.0–52.0)
Hemoglobin: 15.9 g/dL (ref 13.0–17.0)
IMMATURE GRANULOCYTES: 0 %
LYMPHS PCT: 26 %
Lymphs Abs: 3.2 10*3/uL (ref 0.7–4.0)
MCH: 31.3 pg (ref 26.0–34.0)
MCHC: 34.4 g/dL (ref 30.0–36.0)
MCV: 90.9 fL (ref 80.0–100.0)
Monocytes Absolute: 0.7 10*3/uL (ref 0.1–1.0)
Monocytes Relative: 6 %
NEUTROS ABS: 7.5 10*3/uL (ref 1.7–7.7)
NEUTROS PCT: 62 %
NRBC: 0 % (ref 0.0–0.2)
PLATELETS: 288 10*3/uL (ref 150–400)
RBC: 5.08 MIL/uL (ref 4.22–5.81)
RDW: 11.7 % (ref 11.5–15.5)
WBC: 12.1 10*3/uL — AB (ref 4.0–10.5)

## 2018-10-25 LAB — ETHANOL

## 2018-10-25 MED ORDER — ACETAMINOPHEN 325 MG PO TABS: 650.0000 mg | ORAL_TABLET | ORAL | Status: DC | PRN

## 2018-10-25 MED ORDER — ALUM & MAG HYDROXIDE-SIMETH 200-200-20 MG/5ML PO SUSP: 30.0000 mL | Freq: Four times a day (QID) | ORAL | Status: DC | PRN

## 2018-10-25 MED ORDER — ZOLPIDEM TARTRATE 5 MG PO TABS: 5.0000 mg | ORAL_TABLET | Freq: Every evening | ORAL | Status: DC | PRN

## 2018-10-25 MED ORDER — ONDANSETRON HCL 4 MG PO TABS: 4.0000 mg | ORAL_TABLET | Freq: Three times a day (TID) | ORAL | Status: DC | PRN

## 2018-10-25 MED ORDER — NICOTINE 7 MG/24HR TD PT24
7.0000 mg | MEDICATED_PATCH | Freq: Every day | TRANSDERMAL | Status: DC
  Administered 2018-10-25: 7 mg via TRANSDERMAL
  Filled 2018-10-25 (×3): qty 1

## 2018-10-25 NOTE — ED Provider Notes (Signed)
[facility] EMERGENCY DEPARTMENT Provider Note   CSN: [id number] Arrival date & time: 10/25/18  0341     History   Chief Complaint No chief complaint on file.   HPI AVERI CACIOPPO is a 22 y.o. male.  The history is provided by the patient and the police.  He was brought in by police because involuntary commitment papers were filed by his father.  His father reports that patient put threats to kill his girlfriend and then kill himself on social media.  Patient denies this.  He does admit to depression and admits to crying spells, early morning wakening, and anhedonia.  He admits to alcohol use and admits to 5 or 6 beers tonight.  He also had taken a Xanax tablet several days ago and smokes marijuana occasionally.  He denies suicidal ideation and homicidal ideation.  He denies hallucinations.  Past Medical History:  Diagnosis Date  . Heart murmur     Patient Active Problem List   Diagnosis Date Noted  . Nausea and vomiting 10/11/2015  . Enteritis 10/11/2015  . ETOH abuse 10/11/2015  . Tobacco abuse 10/11/2015  . Leukocytosis 10/11/2015    Past Surgical History:  Procedure Laterality Date  . BACK SURGERY          Home Medications    Prior to Admission medications   Medication Sig Start Date End Date Taking? Authorizing Provider  cyclobenzaprine (FLEXERIL) 5 MG tablet Take 1 tablet (5 mg total) by mouth 3 (three) times daily as needed for muscle spasms. 01/05/18   Burgess Amor, PA-C  ondansetron (ZOFRAN) 8 MG tablet Take 1 tablet (8 mg total) by mouth every 4 (four) hours as needed. 09/04/18   Donnetta Hutching, MD  predniSONE (DELTASONE) 10 MG tablet Take 6 tablets day one, 5 tablets day two, 4 tablets day three, 3 tablets day four, 2 tablets day five, then 1 tablet day six 01/05/18   Burgess Amor, PA-C    Family History No family history on file.  Social History Social History   Tobacco Use  . Smoking status: Current Every Day Smoker    Packs/day: 0.25    Types:  Cigarettes  . Smokeless tobacco: Current User    Types: Chew  Substance Use Topics  . Alcohol use: Yes    Comment: occasionally  . Drug use: No     Allergies   Patient has no known allergies.   Review of Systems Review of Systems  All other systems reviewed and are negative.    Physical Exam Updated Vital Signs BP (!) 142/93   Pulse (!) 121   Temp 98.9 F (37.2 C) (Oral)   Resp 18   Ht 5\' 8"  (1.727 m)   Wt 113.4 kg   SpO2 98%   BMI 38.01 kg/m   Physical Exam  Nursing note and vitals reviewed.  22 year old male, resting comfortably and in no acute distress. Vital signs are significant for elevated heart rate and blood pressure. Oxygen saturation is 98%, which is normal. Head is normocephalic and atraumatic. PERRLA, EOMI. Oropharynx is clear. Neck is nontender and supple without adenopathy or JVD. Back is nontender and there is no CVA tenderness. Lungs are clear without rales, wheezes, or rhonchi. Chest is nontender. Heart has regular rate and rhythm without murmur. Abdomen is soft, flat, nontender without masses or hepatosplenomegaly and peristalsis is normoactive. Extremities have no cyanosis or edema, full range of motion is present. Skin is warm and dry without rash. Neurologic: Mental status  is normal, cranial nerves are intact, there are no motor or sensory deficits.  ED Treatments / Results  Labs (all labs ordered are listed, but only abnormal results are displayed) Labs Reviewed  RAPID URINE DRUG SCREEN, HOSP PERFORMED - Abnormal; Notable for the following components:      Result Value   Cocaine POSITIVE (*)    Benzodiazepines POSITIVE (*)    Tetrahydrocannabinol POSITIVE (*)    All other components within normal limits  COMPREHENSIVE METABOLIC PANEL - Abnormal; Notable for the following components:   Glucose, Bld 132 (*)    All other components within normal limits  CBC WITH DIFFERENTIAL/PLATELET - Abnormal; Notable for the following components:    WBC 12.1 (*)    Eosinophils Absolute 0.6 (*)    All other components within normal limits  ETHANOL   Procedures Procedures   Medications Ordered in ED Medications - No data to display   Initial Impression / Assessment and Plan / ED Course  I have reviewed the triage vital signs and the nursing notes.  Pertinent labs & imaging results that were available during my care of the patient were reviewed by me and considered in my medical decision making (see chart for details).  Symptoms of depression, but patient denying suicidal or homicidal ideation.  Old records are reviewed, and he has no relevant past visits.  Patient states that his father was doing what he could to break up the relationship between patient and his girlfriend.  Will check screening labs and have patient evaluated by TTS.  Labs are unremarkable.  Drug screen is positive for benzodiazepines, consistent with his stated use of alprazolam.  Drug screen also positive for cocaine and marijuana.  TTS evaluation has been completed, and they are recommending he stay for evaluation by psychiatrist later today.  Final Clinical Impressions(s) / ED Diagnoses   Final diagnoses:  Depression, unspecified depression type  Cocaine abuse [facility](HCC)  Marijuana abuse    ED Discharge Orders    None       Dione BoozeGlick, [place], MD 10/25/18 [phone]

## 2018-10-25 NOTE — ED Notes (Signed)
PT called for ride at discharge. His girlfriend is on the way to pick him up.

## 2018-10-25 NOTE — ED Notes (Signed)
Placed breakfast tray on bedside table. Pt is resting at this time.

## 2018-10-25 NOTE — ED Notes (Signed)
Pt wanded by security prior to triage.

## 2018-10-25 NOTE — Consult Note (Addendum)
                            Tele-psych consultation  Patient is seen and chart is reviewed. The patient continues to deny all information contained in the IVC. He states "I have been depressed. But I broke up with my girlfriend. I used some xanax to cope but it did not help. I was down but getting over it.  I never said I wanted to hurt myself or her. My father has mental illness and just gets ideas in his head. I don't feel the need to come into the hospital. I'm ready to go home." Gabriel Chapman appeared sleepy during the assessment but denied any suicidal or homicidal ideation several times. He denies have a mental health history but reports Bipolar history in his father. Patient based on today's assessment does not meet criteria for inpatient psychiatric admission. His IVC may be rescinded to facilitate discharge.  Gabriel Chapman PMHNP-C  10/25/2018 11:50 am

## 2018-10-25 NOTE — ED Notes (Signed)
Pt verbally understands his IVC condition and RN signed off on [employer] to leave [facility]. Sitetr at bedside and Pts belongings secured in locker.

## 2018-10-25 NOTE — ED Triage Notes (Signed)
Pt brought in with IVC papers taken out by his Dad for danger to self and others. Pt says his Dad has been telling him that if he tries to work things out with his girlfriend he would have him committed. Per IVC papers Dad reports pt put on social media threats to kill his girlfriend and then kill himself. Pt denies this.  Officer meyers reports pt was sleeping when they arrived to pick him up at girlfriend's house tonight. Girlfriend and her kids were reported sleeping as well. Pt reports having 5 or six beers last night.

## 2018-10-25 NOTE — BH Assessment (Addendum)
Tele Assessment Note   Patient Name: Gabriel Chapman MRN: [id number] Referring Physician: Ardith Dark, MD Location of Patient: [facility] ED, [phone] Location of Provider: Behavioral Health TTS Department  Gabriel Chapman is an 22 y.o. single male who presents unaccompanied to [facility] ED via [facility] after Pt's father petitioned for involuntary commitment. Per IVC papers, Pt's father reports Pt put on social media threats to kill his girlfriend and then kill himself. Pt denies this. Pt says his father "hates" his girlfriend. Officer Gabriel Chapman reports pt was sleeping when they arrived to pick him up at girlfriend's house tonight. Girlfriend and her kids were reported sleeping as well. Pt says he and his girlfriend have broken up and "my heart is destroyed."   Pt acknowledges depressive symptoms related to breaking up with his girlfriend. Pt acknowledges symptoms including crying spells, social withdrawal and irritability. He states he has "a problem with anger" and he screams, yells and hits objects when upset. He denies assaulting people. Pt says he has anxiety and panic attacks. He says he has taken Xanax given to him by someone else and wants to ask a physician for a prescription. Pt denies current suicidal ideation. He reports one previous suicide attempt as a child by jumping from second story balcony in response to his parent's divorce. Pt reports he was psychiatrically hospitalized and went to therapy following that incident. He denies current homicidal ideation or history of violence. He denies any history of psychotic symptoms. Pt reports he does use alcohol, cocaine and marijuana (see below for details of use).  Pt identifies breakup with his girlfriend as his primary stressor. He states he is currently living with a friend. He says he is currently unemployed but has a job pending next week. He has no current outpatient mental health providers.  Pt is dressed in hospital scrubs. He  is drowsy and dozed off several times during assessment. He is oriented x4. Pt speaks in a clear tone, at moderate volume and normal pace. Motor behavior appears normal. Eye contact is good. Pt's mood is euthymic and affect is congruent with mood. Thought process is coherent and relevant. There is no indication Pt is currently responding to internal stimuli or experiencing delusional thought content. Pt was calm and cooperative throughout assessment. He states he doesn't believe he need mental health treatment.  Diagnosis:  F32.2 Major depressive disorder, Single episode, Severe  Past Medical History:  Past Medical History:  Diagnosis Date  . Heart murmur     Past Surgical History:  Procedure Laterality Date  . BACK SURGERY      Family History: No family history on file.  Social History:  reports that he has been smoking cigarettes. He has been smoking about 0.25 packs per day. His smokeless tobacco use includes chew. He reports that he drinks about 5.0 - 6.0 standard drinks of alcohol per week. He reports that he has current or past drug history. Drug: Marijuana.  Additional Social History:  Alcohol / Drug Use Pain Medications: Pt denies Prescriptions: Pt denies Over the Counter: Pt denies History of alcohol / drug use?: Yes Longest period of sobriety (when/how long): Several months Negative Consequences of Use: Legal, Personal relationships Withdrawal Symptoms: (Pt denies) Substance #1 Name of Substance 1: Alcohol 1 - Age of First Use: Adolescent 1 - Amount (size/oz): 8-10 beers 1 - Frequency: On weekends 1 - Duration: Ongoing  1 - Last Use / Amount: 10/24/18 Substance #2 Name of Substance 2: Cocaine (powder)  2 - Age of First Use: 19 2 - Amount (size/oz): Varies 2 - Frequency: 2-3 times per year 2 - Duration: Ongoing 2 - Last Use / Amount: 10/23/18 Substance #3 Name of Substance 3: Marijuana 3 - Age of First Use: Adolescent 3 - Amount (size/oz): Small amount 3 -  Frequency: 2-3 times per year 3 - Duration: Ongoing 3 - Last Use / Amount: 10/23/18 Substance #4 Name of Substance 4: Xanax 4 - Age of First Use: 21 4 - Amount (size/oz): 0.5-1 mg 4 - Frequency: 1-2 times per year 4 - Duration: Ongoing 4 - Last Use / Amount: 10/24/18  CIWA: CIWA-Ar BP: (!) 142/93 Pulse Rate: (!) 121 COWS:    Allergies: No Known Allergies  Home Medications:  (Not in a hospital admission)  OB/GYN Status:  No LMP for male patient.  General Assessment Data Location of Assessment: [facility] TTS Assessment: In system Is this a Tele or Face-to-Face Assessment?: Tele Assessment Is this an Initial Assessment or a Re-assessment for this encounter?: Initial Assessment Patient Accompanied by:: N/A(Alone) Language Other than English: No Living Arrangements: Other (Comment) What gender do you identify as?: Male Marital status: Single Maiden name: NA Pregnancy Status: No Living Arrangements: Non-relatives/Friends Can pt return to current living arrangement?: Yes Admission Status: Involuntary Petitioner: Family member Is patient capable of signing voluntary admission?: Yes Referral Source: Self/Family/Friend Insurance type: Engineer, [employer]     Crisis Care Plan Living Arrangements: Non-relatives/Friends Legal Guardian: Other:(Self) Name of Psychiatrist: None Name of Therapist: None  Education Status Is patient currently in school?: No Is the patient employed, unemployed or receiving disability?: Unemployed  Risk to self with the past 6 months Suicidal Ideation: Yes-Currently Present Has patient been a risk to self within the past 6 months prior to admission? : No Suicidal Intent: No Has patient had any suicidal intent within the past 6 months prior to admission? : No Is patient at risk for suicide?: Yes Suicidal Plan?: No Has patient had any suicidal plan within the past 6 months prior to admission? : No Access to Means: Yes Specify Access to Suicidal  Means: Pt has access to guns What has been your use of drugs/alcohol within the last 12 months?: Pt using alcohol, cocaine, Xanax and marijuana Previous Attempts/Gestures: Yes How many times?: 1 Other Self Harm Risks: None Triggers for Past Attempts: Family contact Intentional Self Injurious Behavior: None Family Suicide History: No Recent stressful life event(s): Loss (Comment), Financial Problems Persecutory voices/beliefs?: No Depression: Yes Depression Symptoms: Despondent, Tearfulness, Isolating, Feeling angry/irritable, Feeling worthless/self pity Substance abuse history and/or treatment for substance abuse?: Yes Suicide prevention information given to non-admitted patients: Not applicable  Risk to Others within the past 6 months Homicidal Ideation: Yes-Currently Present Does patient have any lifetime risk of violence toward others beyond the six months prior to admission? : No Thoughts of Harm to Others: No Current Homicidal Intent: No Current Homicidal Plan: No Access to Homicidal Means: No Identified Victim: Girlfriend History of harm to others?: No Assessment of Violence: None Noted Violent Behavior Description: Pt denies history of violence Does patient have access to weapons?: Yes (Comment) Criminal Charges Pending?: Yes Describe Pending Criminal Charges: DUI Does patient have a court date: Yes Court Date: 12/07/18 Is patient on probation?: No  Psychosis Hallucinations: None noted Delusions: None noted  Mental Status Report Appearance/Hygiene: In scrubs Eye Contact: Fair Motor Activity: Unremarkable Speech: Logical/coherent Level of Consciousness: Drowsy Mood: Euthymic Affect: Appropriate to circumstance Anxiety Level: Panic Attacks Panic attack frequency:  Infrequent Most recent panic attack: 10/23/18 Thought Processes: Coherent, Relevant Judgement: Partial Orientation: Person, Place, Time, Situation, Appropriate for developmental age Obsessive  Compulsive Thoughts/Behaviors: None  Cognitive Functioning Concentration: Normal Memory: Recent Intact, Remote Intact Is patient IDD: No Insight: Fair Impulse Control: Fair Appetite: Good Have you had any weight changes? : No Change Sleep: No Change Total Hours of Sleep: 7 Vegetative Symptoms: None  ADLScreening [facility] Assessment Services) Patient's cognitive ability adequate to safely complete daily activities?: Yes Patient able to express need for assistance with ADLs?: Yes Independently performs ADLs?: Yes (appropriate for developmental age)  Prior Inpatient Therapy Prior Inpatient Therapy: Yes Prior Therapy Dates: Age 22 Prior Therapy Facilty/Provider(s): Unknown Reason for Treatment: SI  Prior Outpatient Therapy Prior Outpatient Therapy: Yes Prior Therapy Dates: Age 41 Prior Therapy Facilty/Provider(s): Unknown Reason for Treatment: Parent's divorce, anger Does patient have an ACCT team?: No Does patient have Intensive In-House Services?  : No Does patient have [facility] services? : No Does patient have P4CC services?: No  ADL Screening (condition at time of admission) Patient's cognitive ability adequate to safely complete daily activities?: Yes Is the patient deaf or have difficulty hearing?: No Does the patient have difficulty seeing, even when wearing glasses/contacts?: No Does the patient have difficulty concentrating, remembering, or making decisions?: No Patient able to express need for assistance with ADLs?: Yes Does the patient have difficulty dressing or bathing?: No Independently performs ADLs?: Yes (appropriate for developmental age) Does the patient have difficulty walking or climbing stairs?: No Weakness of Legs: None Weakness of Arms/Hands: None  Home Assistive Devices/Equipment Home Assistive Devices/Equipment: None    Abuse/Neglect Assessment (Assessment to be complete while patient is alone) Abuse/Neglect Assessment Can Be Completed:  Yes Physical Abuse: Denies Verbal Abuse: Yes, past (Comment)(Pt reports he has been verbally abused in the past.) Sexual Abuse: Denies Exploitation of patient/patient's resources: Denies Self-Neglect: Denies     Merchant navy officer (For Healthcare) Does Patient Have a Medical Advance Directive?: No Would patient like information on creating a medical advance directive?: No - Patient declined          Disposition: Gave clinical report to Nira Conn, NP who recommended Pt be observed and evaluated later this morning by psychiatry. Notified Dr. Dione Booze and Cherylin Mylar, RN of recommendation.  Disposition Initial Assessment Completed for this Encounter: Yes  This service was provided via telemedicine using a 2-way, interactive audio and video technology.  Names of all persons participating in this telemedicine service and their role in this encounter. Name: Gabriel Chapman Role: Patient  Name: Shela Commons. LPC Role: TTS counselor         Harlin Rain Patsy [place], [facility], [facility], [facility] Triage Specialist [phone]  Patsy Baltimore, Harlin Rain 10/25/2018 6:28 AM

## 2018-10-25 NOTE — ED Notes (Signed)
Pt given lunch tray.

## 2018-10-25 NOTE — ED Notes (Addendum)
During assessment of Pt, Pt admitted he attempted to jump off 2-story balcony when he was younger and his parents were going through a divorce. Pt states his brother pulled him back and prevented this from happening. Pt admitted he feels like he has anxiety problems but has not confided this to his PCP to seek treatment. Pt admits to buying Xanax from undisclosed parties for anxiety. Pt states he went to ex-girlfriends house tonight to try to leave relationship on a positive note and is depressed about his break-up. Pt disclosed he does have anger problems but bottles them up and has friends that he can talk to about his problems, but does not want to seek out-patient help for these problems at this time.

## 2018-10-25 NOTE — ED Notes (Signed)
IVC paper resended at this time by EDP.

## 2018-10-25 NOTE — Discharge Instructions (Addendum)
Follow-up per behavioral health.  If you become suicidal or homicidal please ask for help.

## 2018-10-25 NOTE — ED Provider Notes (Signed)
Patient observed in the ER.  Behavioral health reassessed and patient is still not suicidal or homicidal denies IVC statements.  Patient stable for close outpatient follow-up.  Patient does not meet inpatient criteria per behavioral health. Plan to rescind IVC.  Gabriel Chapman    Guida Asman, MD 10/25/18 1341

## 2018-10-25 NOTE — ED Notes (Signed)
[facility] Psychiatry Consult later this morning for Pt due to conflicting stories between Pt and Pts Father who took out IVC Paperwork.

## 2018-10-27 ENCOUNTER — Other Ambulatory Visit: Payer: Self-pay

## 2018-10-27 ENCOUNTER — Encounter (HOSPITAL_COMMUNITY): Payer: Self-pay | Admitting: *Deleted

## 2018-10-27 ENCOUNTER — Emergency Department (HOSPITAL_COMMUNITY)
Admission: EM | Admit: 2018-10-27 | Discharge: 2018-10-27 | Disposition: A | Discharge status: Other Acute Inpatient Hospital | Payer: 59 | Attending: Emergency Medicine | Admitting: Emergency Medicine

## 2018-10-27 ENCOUNTER — Inpatient Hospital Stay (HOSPITAL_COMMUNITY)
Admission: AD | Admit: 2018-10-27 | Discharge: 2018-10-29 | DRG: 885 | Disposition: A | Discharge status: Home / Self Care | Payer: 59 | Source: Intra-hospital | Attending: Psychiatry | Admitting: Psychiatry

## 2018-10-27 DIAGNOSIS — F1420 Cocaine dependence, uncomplicated: Secondary | ICD-10-CM | POA: Diagnosis present

## 2018-10-27 DIAGNOSIS — G4700 Insomnia, unspecified: Secondary | ICD-10-CM | POA: Diagnosis not present

## 2018-10-27 DIAGNOSIS — F322 Major depressive disorder, single episode, severe without psychotic features: Principal | ICD-10-CM | POA: Diagnosis present

## 2018-10-27 DIAGNOSIS — M549 Dorsalgia, unspecified: Secondary | ICD-10-CM | POA: Diagnosis not present

## 2018-10-27 DIAGNOSIS — R45851 Suicidal ideations: Secondary | ICD-10-CM | POA: Diagnosis present

## 2018-10-27 DIAGNOSIS — F17210 Nicotine dependence, cigarettes, uncomplicated: Secondary | ICD-10-CM | POA: Diagnosis present

## 2018-10-27 DIAGNOSIS — Y900 Blood alcohol level of less than 20 mg/100 ml: Secondary | ICD-10-CM | POA: Insufficient documentation

## 2018-10-27 DIAGNOSIS — F1220 Cannabis dependence, uncomplicated: Secondary | ICD-10-CM | POA: Insufficient documentation

## 2018-10-27 DIAGNOSIS — F1320 Sedative, hypnotic or anxiolytic dependence, uncomplicated: Secondary | ICD-10-CM | POA: Diagnosis present

## 2018-10-27 DIAGNOSIS — F1310 Sedative, hypnotic or anxiolytic abuse, uncomplicated: Secondary | ICD-10-CM | POA: Insufficient documentation

## 2018-10-27 DIAGNOSIS — F419 Anxiety disorder, unspecified: Secondary | ICD-10-CM

## 2018-10-27 DIAGNOSIS — F329 Major depressive disorder, single episode, unspecified: Secondary | ICD-10-CM | POA: Diagnosis present

## 2018-10-27 DIAGNOSIS — F332 Major depressive disorder, recurrent severe without psychotic features: Secondary | ICD-10-CM | POA: Diagnosis not present

## 2018-10-27 DIAGNOSIS — F1020 Alcohol dependence, uncomplicated: Secondary | ICD-10-CM | POA: Insufficient documentation

## 2018-10-27 DIAGNOSIS — F1920 Other psychoactive substance dependence, uncomplicated: Secondary | ICD-10-CM

## 2018-10-27 DIAGNOSIS — Z008 Encounter for other general examination: Secondary | ICD-10-CM | POA: Insufficient documentation

## 2018-10-27 DIAGNOSIS — Z23 Encounter for immunization: Secondary | ICD-10-CM

## 2018-10-27 DIAGNOSIS — F1410 Cocaine abuse, uncomplicated: Secondary | ICD-10-CM | POA: Diagnosis not present

## 2018-10-27 LAB — BASIC METABOLIC PANEL
ANION GAP: 12 (ref 5–15)
BUN: 5 mg/dL — ABNORMAL LOW (ref 6–20)
CALCIUM: 9.5 mg/dL (ref 8.9–10.3)
CO2: 21 mmol/L — AB (ref 22–32)
Chloride: 108 mmol/L (ref 98–111)
Creatinine, Ser: 0.58 mg/dL — ABNORMAL LOW (ref 0.61–1.24)
GFR calc non Af Amer: >60 mL/min (ref >60)
Glucose, Bld: 99 mg/dL (ref 70–99)
Potassium: 3.5 mmol/L (ref 3.5–5.1)
Sodium: 141 mmol/L (ref 135–145)

## 2018-10-27 LAB — ETHANOL: Alcohol, Ethyl (B): 10 mg/dL — ABNORMAL HIGH (ref <10)

## 2018-10-27 LAB — CBC WITH DIFFERENTIAL/PLATELET
Abs Immature Granulocytes: 0.06 10*3/uL (ref 0.00–0.07)
BASOS ABS: 0.1 10*3/uL (ref 0.0–0.1)
Basophils Relative: 1 %
EOS PCT: 1 %
Eosinophils Absolute: 0.1 10*3/uL (ref 0.0–0.5)
HEMATOCRIT: 48 % (ref 39.0–52.0)
Hemoglobin: 16.4 g/dL (ref 13.0–17.0)
IMMATURE GRANULOCYTES: 1 %
LYMPHS ABS: 2.4 10*3/uL (ref 0.7–4.0)
LYMPHS PCT: 21 %
MCH: 31.1 pg (ref 26.0–34.0)
MCHC: 34.2 g/dL (ref 30.0–36.0)
MCV: 91.1 fL (ref 80.0–100.0)
Monocytes Absolute: 0.5 10*3/uL (ref 0.1–1.0)
Monocytes Relative: 4 %
NEUTROS PCT: 72 %
NRBC: 0 % (ref 0.0–0.2)
Neutro Abs: 8.2 10*3/uL — ABNORMAL HIGH (ref 1.7–7.7)
Platelets: 252 10*3/uL (ref 150–400)
RBC: 5.27 MIL/uL (ref 4.22–5.81)
RDW: 11.8 % (ref 11.5–15.5)
WBC: 11.3 10*3/uL — AB (ref 4.0–10.5)

## 2018-10-27 MED ORDER — HYDROXYZINE HCL 25 MG PO TABS
25.0000 mg | ORAL_TABLET | Freq: Three times a day (TID) | ORAL | Status: DC | PRN
  Administered 2018-10-27 – 2018-10-28 (×4): 25 mg via ORAL
  Filled 2018-10-27 (×4): qty 1

## 2018-10-27 MED ORDER — LORAZEPAM 1 MG PO TABS
1.0000 mg | ORAL_TABLET | ORAL | Status: DC | PRN
  Administered 2018-10-27: 1 mg via ORAL
  Filled 2018-10-27: qty 1

## 2018-10-27 MED ORDER — SERTRALINE HCL 25 MG PO TABS
25.0000 mg | ORAL_TABLET | Freq: Every day | ORAL | Status: DC
  Administered 2018-10-27 – 2018-10-28 (×2): 25 mg via ORAL
  Filled 2018-10-27 (×4): qty 1

## 2018-10-27 MED ORDER — ACETAMINOPHEN 325 MG PO TABS: 650.0000 mg | ORAL_TABLET | Freq: Four times a day (QID) | ORAL | Status: DC | PRN

## 2018-10-27 MED ORDER — MAGNESIUM HYDROXIDE 400 MG/5ML PO SUSP: 30.0000 mL | Freq: Every day | ORAL | Status: DC | PRN

## 2018-10-27 MED ORDER — ACETAMINOPHEN 325 MG PO TABS
650.0000 mg | ORAL_TABLET | ORAL | Status: DC | PRN
  Administered 2018-10-27: 650 mg via ORAL
  Filled 2018-10-27: qty 2

## 2018-10-27 MED ORDER — INFLUENZA VAC SPLIT QUAD 0.5 ML IM SUSY
0.5000 mL | PREFILLED_SYRINGE | INTRAMUSCULAR | Status: AC
  Administered 2018-10-29: 0.5 mL via INTRAMUSCULAR
  Filled 2018-10-27: qty 0.5

## 2018-10-27 MED ORDER — TRAZODONE HCL 50 MG PO TABS
50.0000 mg | ORAL_TABLET | Freq: Every evening | ORAL | Status: DC | PRN
  Administered 2018-10-27 – 2018-10-28 (×2): 50 mg via ORAL
  Filled 2018-10-27 (×2): qty 1

## 2018-10-27 MED ORDER — ALUM & MAG HYDROXIDE-SIMETH 200-200-20 MG/5ML PO SUSP: 30.0000 mL | ORAL | Status: DC | PRN

## 2018-10-27 NOTE — ED Notes (Signed)
Pt wanded by security. 

## 2018-10-27 NOTE — [facility] Group Notes (Signed)
LCSW Group Therapy Note   10/27/2018 1:15pm   Type of Therapy and Topic:  Group Therapy:  Overcoming Obstacles   Participation Level:  Did Not Attend--pt invited. Chose to remain in bed.    Description of Group:    In this group patients will be encouraged to explore what they see as obstacles to their own wellness and recovery. They will be guided to discuss their thoughts, feelings, and behaviors related to these obstacles. The group will process together ways to cope with barriers, with attention given to specific choices patients can make. Each patient will be challenged to identify changes they are motivated to make in order to overcome their obstacles. This group will be process-oriented, with patients participating in exploration of their own experiences as well as giving and receiving support and challenge from other group members.   Therapeutic Goals: 1. Patient will identify personal and current obstacles as they relate to admission. 2. Patient will identify barriers that currently interfere with their wellness or overcoming obstacles.  3. Patient will identify feelings, thought process and behaviors related to these barriers. 4. Patient will identify two changes they are willing to make to overcome these obstacles:      Summary of Patient Progress   x   Therapeutic Modalities:   Cognitive Behavioral Therapy Solution Focused Therapy Motivational Interviewing Relapse Prevention Therapy  Rona RavensHeather S Anacristina Steffek, LCSW 10/27/2018 11:39 AM

## 2018-10-27 NOTE — Tx Team (Signed)
Initial Treatment Plan 10/27/2018 3:11 PM Gabriel BlakesBrant [name] JXB:[id number]    PATIENT STRESSORS: Financial difficulties Occupational concerns Substance abuse Traumatic event   PATIENT STRENGTHS: Average or above average intelligence Communication skills Physical Health Supportive family/friends   PATIENT IDENTIFIED PROBLEMS: "I need help coping with my depression"  Depression  Suicidal Ideation  Breakup with girlfriend               DISCHARGE CRITERIA:  Improved stabilization in mood, thinking, and/or behavior Motivation to continue treatment in a less acute level of care Reduction of life-threatening or endangering symptoms to within safe limits Withdrawal symptoms are absent or subacute and managed without 24-hour nursing intervention  PRELIMINARY DISCHARGE PLAN: Attend 12-step recovery group Outpatient therapy  PATIENT/FAMILY INVOLVEMENT: This treatment plan has been presented to and reviewed with the patient, Gabriel Chapman.  The patient and family have been given the opportunity to ask questions and make suggestions.  Cranford MonBeaudry, Daphene Chisholm Evans, RN 10/27/2018, 3:11 PM

## 2018-10-27 NOTE — ED Notes (Signed)
Pt recommended for inpatient placement

## 2018-10-27 NOTE — Progress Notes (Signed)
Nurse, Victorino DikeJennifer [facility]. Pt can arrive after 9am. Call report to [phone]. Dr. Laurell RoofProvos will be the attending doctor.

## 2018-10-27 NOTE — ED Provider Notes (Signed)
[facility] Provider Note   CSN: [id number] Arrival date & time: 10/27/18  0214     History   Chief Complaint Chief Complaint  Patient presents with  . V70.1    HPI Gabriel Chapman is a 22 y.o. male.  The history is provided by the patient and a parent.  Mental Health Problem  Presenting symptoms: suicidal thoughts and suicidal threats   Patient accompanied by:  Parent Degree of incapacity (severity):  Severe Onset quality:  Gradual Timing:  Constant Progression:  Worsening Chronicity:  New Context: alcohol use   Treatment compliance:  Untreated Relieved by:  Nothing Worsened by:  Alcohol Patient presents for repeat ER visit for concern for psychiatric illness.  Patient has been depressed recently due to break-up with his girlfriend.  He has been drinking alcohol and has had made threats to harm himself.  He has told his father that he wishes he were dead.   Patient was seen and discharged from the ER on December 2.  Since that time his symptoms have worsened Past Medical History:  Diagnosis Date  . Heart murmur     Patient Active Problem List   Diagnosis Date Noted  . Cocaine abuse (HCC) 10/25/2018  . Nausea and vomiting 10/11/2015  . Enteritis 10/11/2015  . ETOH abuse 10/11/2015  . Tobacco abuse 10/11/2015  . Leukocytosis 10/11/2015    Past Surgical History:  Procedure Laterality Date  . BACK SURGERY          Home Medications    Prior to Admission medications   Medication Sig Start Date End Date Taking? Authorizing Provider  ibuprofen (ADVIL,MOTRIN) 200 MG tablet Take 800 mg by mouth every 6 (six) hours as needed.    [provider]    Family History No family history on file.  Social History Social History   Tobacco Use  . Smoking status: Current Every Day Smoker    Packs/day: 0.25    Types: Cigarettes  . Smokeless tobacco: Current User    Types: Chew  Substance Use Topics  . Alcohol use: Yes   Alcohol/week: 5.0 - 6.0 standard drinks    Types: 5 - 6 Cans of beer per week    Comment: "has drank for past 3 days" but "normally just on weekends"   . Drug use: Yes    Types: Marijuana     Allergies   Patient has no known allergies.   Review of Systems Review of Systems  Constitutional: Negative for fever.  Musculoskeletal: Positive for back pain.       Intermittent chronic back pain   Psychiatric/Behavioral: Positive for suicidal ideas.  All other systems reviewed and are negative.    Physical Exam Updated Vital Signs BP (!) 161/91   Pulse (!) 110   Temp 97.8 F (36.6 C)   Resp 20   Ht 1.727 m (5\' 8" )   Wt 113 kg   SpO2 96%   BMI 37.88 kg/m   Physical Exam  CONSTITUTIONAL: Disheveled, smells of ETOH HEAD: Normocephalic/atraumatic EYES: EOMI, pupils dilated bilaterally ENMT: Mucous membranes moist NECK: supple no meningeal signs SPINE/BACK:entire spine nontender CV: S1/S2 noted, no murmurs/rubs/gallops noted LUNGS: Lungs are clear to auscultation bilaterally, no apparent distress ABDOMEN: soft, nontender  NEURO: Pt is awake/alert/appropriate, moves all extremitiesx4.  No facial droop.   EXTREMITIES: pulses normal/equal, full ROM SKIN: warm, color normal PSYCH: pt is anxious and restless  ED Treatments / Results  Labs (all labs ordered are listed, but only  abnormal results are displayed) Labs Reviewed  BASIC METABOLIC PANEL - Abnormal; Notable for the following components:      Result Value   CO2 21 (*)    BUN 5 (*)    Creatinine, Ser 0.58 (*)    All other components within normal limits  CBC WITH DIFFERENTIAL/PLATELET - Abnormal; Notable for the following components:   WBC 11.3 (*)    Neutro Abs 8.2 (*)    All other components within normal limits  ETHANOL - Abnormal; Notable for the following components:   Alcohol, Ethyl (B) 10 (*)    All other components within normal limits  RAPID URINE DRUG SCREEN, HOSP PERFORMED     EKG None  Radiology No results found.  Procedures Procedures    Medications Ordered in ED Medications  acetaminophen (TYLENOL) tablet 650 mg (650 mg Oral Given 10/27/18 0406)  LORazepam (ATIVAN) tablet 1 mg (1 mg Oral Given 10/27/18 0406)     Initial Impression / Assessment and Plan / ED Course  I have reviewed the triage vital signs and the nursing notes.  Pertinent labs   results that were available during my care of the patient were reviewed by me and considered in my medical decision making (see chart for details).     4:19 AM Patient presents with worsening symptoms of depression, alcohol abuse and reporting thoughts of harming himself.  I feel this patient should be admitted to a psychiatric hospital.  He is currently here voluntarily, but if he makes a threat to leave an IVC will need to be enacted.  Patient is medically stable at this time.  Final Clinical Impressions(s) / ED Diagnoses   Final diagnoses:  Suicidal ideation    ED Discharge Orders    None       Zadie Rhine, MD 10/27/18 [phone]

## 2018-10-27 NOTE — ED Notes (Cosign Needed Addendum)
TTS Clinician attempting assessment multiple times, TTS machine not in patient room and is not ready.

## 2018-10-27 NOTE — BH Assessment (Signed)
Tele Assessment Note   Patient Name: Gabriel Chapman MRN: [id number] Referring Physician: Dr. Bebe Shaggy  Location of Patient: [facility] Location of Provider: [facility]  SHELTON SQUARE is an 22 y.o., single male. Pt presented to [facility] voluntarily and accompanied by his father, Doyl Bitting. Pt reports that he was here a couple days ago due to the same issue. Pt stated that he broke up with his girlfriend and since that time he has been increasingly depressed, using alcohol heavily and has been talking about suicide. Pt father attests to this information. Pt reports drinking 5 beers 10/26/2018. Pt reports symptoms of depression including isolation, loss of appetite, insomnia, feelings of hopelessness, helplessness and guilt. Pt reports irritability and lessened desire to participate in normal activities that bring pleasure. Pt reports severe panic symptoms for the past 2 days. Pt denied current therapy and psychiatry Pt denied current MH medications   Pt reports that he is now living with his father due to this incident. Pt father stated that there are weapons in the home that will be removed. Pt reports being unemployed and having no income. Pt reports being single and having no children. Pt reports having insurance through [employer]. Pt denies major medical issues.   Pt oriented to person, place, time and situation. Pt presented alert, dressed appropriately and groomed. Pt spoke clearly, coherently and did not seem to be under the influence of any substances. Pt made good eye contact and answered questions appropriately. Pt presented sad, with congruent mood, but calm and open to the assessment process. Pt presented with no impairments of remote or recent memory.   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 Heart murmur     Past Surgical History:  Procedure Laterality Date  . BACK SURGERY      Family History: No family history on file.  Social History:  reports that he has been smoking cigarettes. He has been smoking about 0.25 packs per day. His smokeless tobacco use includes chew. He reports that he drinks about 5.0 - 6.0 standard drinks of alcohol per week. He reports that he has current or past drug history. Drug: Marijuana.  Additional Social History:  Alcohol / Drug Use Pain Medications: SEE MAR.  Prescriptions: Current MH medications denid.  Over the Counter: SEE MAR.  History of alcohol / drug use?: Yes Longest period of sobriety (when/how long): Pt reports long periods wih no use.  Negative Consequences of Use: Personal relationships Substance #1 Name of Substance 1: Alcohol  1 - Age of First Use: Teens 1 - Amount (size/oz): 8-10 Beers 1 - Frequency: Weekends  1 - Duration: 3 Years  1 - Last Use / Amount: 10/27/2018 5 Beers Substance #2 Name of Substance 2: Cocaine  2 - Age of First Use: 19 2 - Amount (size/oz): Varies 2 - Frequency: Very little use.  2 - Duration: 3 Years 2 - Last Use / Amount: 10/23/2018 Substance #3 Name of Substance 3: Marijuana  3 - Age of First Use: Teens 3 - Amount (size/oz): "A little bit."  3 - Frequency: Very few times a year.  3 - Duration: 3 Years  3 - Last Use / Amount: 10/23/2018 Substance #4 Name of Substance 4: Benzodiazepines (Xanax)  4 -  Age of First Use: 21 4 - Amount (size/oz): 1mg   4 - Frequency: Used once or twice.  4 - Duration: 1 year  4 - Last Use / Amount: 10/24/2018.  CIWA: CIWA-Ar BP: (!) 161/91 Pulse Rate: (!) 110 COWS:    Allergies: No Known Allergies  Home Medications:  (Not in a hospital admission)  OB/GYN Status:  No LMP for male patient.  General Assessment Data Location of Assessment: [facility] T[facility]  Assessment: In system Is this a Tele or Face-to-Face Assessment?: Tele Assessment Is this an Initial Assessment or a Re-assessment for this encounter?: Initial Assessment Patient Accompanied by:: (Pt father present, Levonne Spiller. ) Language Other than English: No Living Arrangements: Other (Comment)(Pt reports living with father as of tonight. ) What gender do you identify as?: Male Marital status: Single Maiden name: . Pregnancy Status: No Living Arrangements: Parent Can pt return to current living arrangement?: Yes Admission Status: Voluntary Petitioner: (N/A) Is patient capable of signing voluntary admission?: Yes Referral Source: Self/Family/Friend Insurance type: [employer].   Medical Screening Exam [facility] Walk-in ONLY) Medical Exam completed: Yes  Crisis Care Plan Living Arrangements: Parent Legal Guardian: Other:(Self ) Name of Psychiatrist: None Name of Therapist: None  Education Status Is patient currently in school?: No Is the patient employed, unemployed or receiving disability?: Unemployed  Risk to self with the past 6 months Suicidal Ideation: Yes-Currently Present Has patient been a risk to self within the past 6 months prior to admission? : Yes Suicidal Intent: Yes-Currently Present Has patient had any suicidal intent within the past 6 months prior to admission? : Yes Is patient at risk for suicide?: Yes Suicidal Plan?: No Has patient had any suicidal plan within the past 6 months prior to admission? : No Access to Means: Yes Specify Access to Suicidal Means: Pt father stated that weapons are in the home.  What has been your use of drugs/alcohol within the last 12 months?: Pt reports use of alcohol prior to hospital visit.  Previous Attempts/Gestures: Yes How many times?: 1 Other Self Harm Risks: Denied.  Triggers for Past Attempts: Family contact Intentional Self Injurious Behavior: None Family Suicide History: No Recent stressful life event(s):  Loss (Comment)(Pt reports breaking up with girlfriend. ) Persecutory voices/beliefs?: No Depression: Yes Depression Symptoms: Despondent, Insomnia, Tearfulness, Fatigue, Isolating, Guilt, Feeling worthless/self pity, Loss of interest in usual pleasures, Feeling angry/irritable Substance abuse history and/or treatment for substance abuse?: Yes Suicide prevention information given to non-admitted patients: Not applicable  Risk to Others within the past 6 months Homicidal Ideation: No Does patient have any lifetime risk of violence toward others beyond the six months prior to admission? : No Thoughts of Harm to Others: No Current Homicidal Intent: No Current Homicidal Plan: No Access to Homicidal Means: No Identified Victim: Denied.  History of harm to others?: No Assessment of Violence: None Noted Violent Behavior Description: Denied Does patient have access to weapons?: Yes (Comment)(Pt father stated that he is removing weapons. ) Criminal Charges Pending?: Yes Describe Pending Criminal Charges: DUI Does patient have a court date: Yes Court Date: 12/07/18 Is patient on probation?: No  Psychosis Hallucinations: None noted Delusions: None noted  Mental Status Report Appearance/Hygiene: In scrubs Eye Contact: Good Motor Activity: Unremarkable Speech: Logical/coherent Level of Consciousness: Drowsy Mood: Depressed Affect: Depressed Anxiety Level: Minimal Panic attack frequency: Pt reports having panic attacks persistently for 2 days.  Most recent panic attack: 10/26/2018 Thought Processes: Coherent, Relevant Judgement: Impaired Orientation: Person, Place, Time, Situation, Appropriate for  developmental age Obsessive Compulsive Thoughts/Behaviors: None  Cognitive Functioning Concentration: Normal Memory: Recent Intact, Remote Intact Is patient IDD: No Insight: Poor Impulse Control: Poor Appetite: Poor Have you had any weight changes? : No Change Sleep: Decreased Total  Hours of Sleep: 0 Vegetative Symptoms: None  ADLScreening [facility] Assessment Services) Patient's cognitive ability adequate to safely complete daily activities?: Yes Patient able to express need for assistance with ADLs?: Yes Independently performs ADLs?: Yes (appropriate for developmental age)  Prior Inpatient Therapy Prior Inpatient Therapy: Yes Prior Therapy Dates: Age 22 Prior Therapy Facilty/Provider(s): Unknown Reason for Treatment: SI  Prior Outpatient Therapy Prior Outpatient Therapy: Yes Prior Therapy Dates: Age 22 Prior Therapy Facilty/Provider(s): Unknown Reason for Treatment: Parent's divorce, anger Does patient have an ACCT team?: No Does patient have Intensive In-House Services?  : No Does patient have [facility] services? : No Does patient have P4CC services?: No  ADL Screening (condition at time of admission) Patient's cognitive ability adequate to safely complete daily activities?: Yes Is the patient deaf or have difficulty hearing?: No Does the patient have difficulty seeing, even when wearing glasses/contacts?: No Does the patient have difficulty concentrating, remembering, or making decisions?: No Patient able to express need for assistance with ADLs?: Yes Does the patient have difficulty dressing or bathing?: No Independently performs ADLs?: Yes (appropriate for developmental age) Does the patient have difficulty walking or climbing stairs?: No Weakness of Legs: None Weakness of Arms/Hands: None  Home Assistive Devices/Equipment Home Assistive Devices/Equipment: None  Therapy Consults (therapy consults require a physician order) PT Evaluation Needed: No OT Evalulation Needed: No SLP Evaluation Needed: No Abuse/Neglect Assessment (Assessment to be complete while patient is alone) Abuse/Neglect Assessment Can Be Completed: Yes Physical Abuse: Denies Verbal Abuse: Yes, past (Comment) Sexual Abuse: Denies Exploitation of patient/patient's resources:  Denies Self-Neglect: Denies Values / Beliefs Cultural Requests During Hospitalization: None Spiritual Requests During Hospitalization: None Consults Spiritual Care Consult Needed: No Social Work Consult Needed: No Merchant navy officerAdvance Directives (For Healthcare) Does Patient Have a Medical Advance Directive?: No Would patient like information on creating a medical advance directive?: No - Patient declined          Disposition: Per Nira ConnJason Berry, NP Pt meets criteria for inpatient treatment. [facility].  Disposition Initial Assessment Completed for this Encounter: Yes Patient referred to: Selena Batten(Kim, [facility] informed of pt disposition. )  This service was provided via telemedicine using a 2-way, interactive audio and video technology.  Names of all persons participating in this telemedicine service and their role in this encounter. Name: Charlann LangeBrant Haymond Role: Patient   Name: Levonne SpillerEllis Liska  Role: Patient Father   Name: Chesley NoonMiriam Jontue Crumpacker Role: Clinician   Name:  Role:    Chesley NoonMiriam Azrielle Springsteen, M.S., [facility], LCAS Triage Specialist [facility]  10/27/2018 5:44 AM

## 2018-10-27 NOTE — [facility] Suicide Risk Assessment (Signed)
[facility] Admission Suicide Risk Assessment   Nursing information obtained from:  Patient Demographic factors:  Caucasian, Unemployed, Male, Low socioeconomic status Current Mental Status:  Self-harm thoughts Loss Factors:  Loss of significant relationship Historical Factors:  Impulsivity Risk Reduction Factors:  Positive therapeutic relationship  Total Time spent with patient: 30 minutes Principal Problem: <principal problem not specified> Diagnosis:  Active Problems:   Severe major depression, single episode, without psychotic features (HCC)  Subjective Data: Patient is seen and examined.  Patient is a 22 year old male with a past psychiatric history significant for cocaine use disorder, benzodiazepine use disorder, cannabis use disorder and depression who presented to the [facility] emergency department on 10/27/2018 with suicidal ideation.  Patient stated that he had had a recent break-up with a long-term relationship.  This occurred approximately 1 to 2 weeks ago.  He stated that he was having a hard time getting over the relationship.  He stated that prior to the relationship he had used cocaine significantly, but has been sober while he had been with her.  He also found out after the break-up that she was involved with a previous relationship male.  After the break-up he began to feel depressed, isolated, lonely, loss of appetite and poor sleeping.  He began to drink alcohol heavily, used cocaine, and took some Xanax from a friend to try and cope with his emotional discontent.  He presented to the emergency room with his father with these depressive symptoms seeking treatment.  The patient stated that he had not seen a psychiatrist before.  He had not been in substance abuse rehabilitation in the past, and had never been in detox.  He does have a DUI, and his court date is in January 2020.  He is currently unemployed.  He lost his job 1 to 2 weeks ago.  He is currently living with his father.  He  admitted to helplessness, hopelessness and worthlessness.  He admitted suicidal ideation.  He was admitted to the hospital for evaluation and stabilization.  Continued Clinical Symptoms:  Alcohol Use Disorder Identification Test Final Score (AUDIT): 9 The "Alcohol Use Disorders Identification Test", Guidelines for Use in Primary Care, Second Edition.  World Science writer [facility]). Score between 0-7:  no or low risk or alcohol related problems. Score between 8-15:  moderate risk of alcohol related problems. Score between 16-19:  high risk of alcohol related problems. Score 20 or above:  warrants further diagnostic evaluation for alcohol dependence and treatment.   CLINICAL FACTORS:   Depression:   Anhedonia Comorbid alcohol abuse/dependence Hopelessness Impulsivity Insomnia Alcohol/Substance Abuse/Dependencies   Musculoskeletal: Strength & Muscle Tone: within normal limits Gait & Station: normal Patient leans: N/A  Psychiatric Specialty Exam: Physical Exam  Nursing note and vitals reviewed. Constitutional: He is oriented to person, place, and time. He appears well-developed and well-nourished.  HENT:  Head: Normocephalic and atraumatic.  Respiratory: Effort normal.  Neurological: He is alert and oriented to person, place, and time.    ROS  Blood pressure 130/70, pulse 77, temperature 98.7 F (37.1 C), temperature source Oral, resp. rate 18, height 5\' 11"  (1.803 m), weight 111.6 kg, SpO2 97 %.Body mass index is 34.31 kg/m.  General Appearance: Disheveled  Eye Contact:  Fair  Speech:  Normal Rate  Volume:  Normal  Mood:  Depressed  Affect:  Congruent  Thought Process:  Coherent and Descriptions of Associations: Intact  Orientation:  Full (Time, Place, and Person)  Thought Content:  Logical  Suicidal Thoughts:  Yes.  without intent/plan  Homicidal Thoughts:  No  Memory:  Immediate;   Fair Recent;   Fair Remote;   Fair  Judgement:  Intact  Insight:  Fair   Psychomotor Activity:  Psychomotor Retardation  Concentration:  Concentration: Fair and Attention Span: Fair  Recall:  [employer]Fair  Fund of Knowledge:  Fair  Language:  Fair  Akathisia:  Negative  Handed:  Right  AIMS (if indicated):     Assets:  Communication Skills Desire for Improvement Housing Physical Health Resilience Social Support  ADL's:  Intact  Cognition:  WNL  Sleep:         COGNITIVE FEATURES THAT CONTRIBUTE TO RISK:  None    SUICIDE RISK:   Minimal: No identifiable suicidal ideation.  Patients presenting with no risk factors but with morbid ruminations; may be classified as minimal risk based on the severity of the depressive symptoms  PLAN OF CARE: Patient is seen and examined.  Patient is a 22 year old male with a past psychiatric history significant for major depression, cocaine use disorder, alcohol use disorder and cannabis use disorder as well as benzodiazepine use disorder.  He will be admitted to the hospital.  He will be integrated into the milieu.  He will be encouraged to attend groups.  He will be seen by social work.  He will be placed on 15-minute safety checks.  He will be monitored for withdrawal syndromes.  He will be started on Zoloft 25 mg p.o. daily.  He will have written for him hydroxyzine 25 mg every 6 hours as needed anxiety.  He will also have available trazodone 50 mg p.o. nightly as needed insomnia.  We will collect collateral information from his father.  I certify that inpatient services furnished can reasonably be expected to improve the patient's condition.   Antonieta PertGreg Lawson Clary, MD 10/27/2018, 3:38 PM

## 2018-10-27 NOTE — Progress Notes (Addendum)
Admission note:  Patient is a 22 yo male admitted for depression.  Patient states, "my girlfriend of over a year broke up with me.  She was cheating.  I found out when I went through her phone."  This happened approximately 5 days ago.  He states he used cocaine 4 days ago.  He smokes weed "occasionally."  Patient states that "I just felt like I shouldn't be here."  He had no specific plan; however, knew he needed help.  Patient states he has also been drinking heavily the "last few days."  He drank 5 bears on 12/3.  He report irritability, insomnia, poor hygiene.  He states he is depressed and wants help.  This is his first inpatient psyche admission.  Patient's UDS was positive for benzos, cocaine and THC.

## 2018-10-27 NOTE — H&P (Signed)
Psychiatric Admission Assessment Adult  Patient Identification: Gabriel Chapman MRN:  [id number] Date of Evaluation:  10/27/2018 Chief Complaint:  MDD  Substance Abuse Disorder Principal Diagnosis: <principal problem not specified> Diagnosis:  Active Problems:   Severe major depression, single episode, without psychotic features (HCC)  History of Present Illness: Patient is seen and examined.  Patient is a 22 year old male with a past psychiatric history significant for cocaine use disorder, benzodiazepine use disorder, cannabis use disorder and depression who presented to the [facility] emergency department on 10/27/2018 with suicidal ideation.  Patient stated that he had had a recent break-up with a long-term relationship.  This occurred approximately 1 to 2 weeks ago.  He stated that he was having a hard time getting over the relationship.  He stated that prior to the relationship he had used cocaine significantly, but has been sober while he had been with her.  He also found out after the break-up that she was involved with a previous relationship male.  After the break-up he began to feel depressed, isolated, lonely, loss of appetite and poor sleeping.  He began to drink alcohol heavily, used cocaine, and took some Xanax from a friend to try and cope with his emotional discontent.  He presented to the emergency room with his father with these depressive symptoms seeking treatment.  The patient stated that he had not seen a psychiatrist before.  He had not been in substance abuse rehabilitation in the past, and had never been in detox.  He does have a DUI, and his court date is in January 2020.  He is currently unemployed.  He lost his job 1 to 2 weeks ago.  He is currently living with his father.  He admitted to helplessness, hopelessness and worthlessness.  He admitted suicidal ideation.  He was admitted to the hospital for evaluation and stabilization.  Associated Signs/Symptoms: Depression  Symptoms:  depressed mood, anhedonia, insomnia, psychomotor retardation, fatigue, feelings of worthlessness/guilt, difficulty concentrating, hopelessness, suicidal thoughts without plan, loss of energy/fatigue, disturbed sleep, (Hypo) Manic Symptoms:  Impulsivity, Anxiety Symptoms:  Excessive Worry, Psychotic Symptoms:  Denied PTSD Symptoms: Negative Total Time spent with patient: 30 minutes  Past Psychiatric History: Patient denied any previous psychiatric treatment, any previous admissions for rehabilitation or detox.  He stated he did get involved with therapy when he was approximately 22 years of age when his mother and father divorced.  He does admit to substance abuse problems with primarily cocaine prior to his relationship with the current male.  Is the patient at risk to self? Yes.    Has the patient been a risk to self in the past 6 months? No.  Has the patient been a risk to self within the distant past? No.  Is the patient a risk to others? No.  Has the patient been a risk to others in the past 6 months? No.  Has the patient been a risk to others within the distant past? No.   Prior Inpatient Therapy:   Prior Outpatient Therapy:    Alcohol Screening: 1. How often do you have a drink containing alcohol?: 4 or more times a week 2. How many drinks containing alcohol do you have on a typical day when you are drinking?: 5 or 6 3. How often do you have six or more drinks on one occasion?: Weekly AUDIT-C Score: 9 4. How often during the last year have you found that you were not able to stop drinking once you had started?: Never  5. How often during the last year have you failed to do what was normally expected from you becasue of drinking?: Never 6. How often during the last year have you needed a first drink in the morning to get yourself going after a heavy drinking session?: Never 7. How often during the last year have you had a feeling of guilt of remorse after  drinking?: Never 8. How often during the last year have you been unable to remember what happened the night before because you had been drinking?: Never 9. Have you or someone else been injured as a result of your drinking?: No 10. Has a relative or friend or a doctor or another health worker been concerned about your drinking or suggested you cut down?: No Alcohol Use Disorder Identification Test Final Score (AUDIT): 9 Intervention/Follow-up: Alcohol Education Substance Abuse History in the last 12 months:  Yes.   Consequences of Substance Abuse: Legal Consequences:  A DUI Previous Psychotropic Medications: No  Psychological Evaluations: No  Past Medical History:  Past Medical History:  Diagnosis Date  . Heart murmur     Past Surgical History:  Procedure Laterality Date  . BACK SURGERY     Family History: History reviewed. No pertinent family history. Family Psychiatric  History: Patient denied any family history of psychiatric illness, substance abuse or suicide. Tobacco Screening:   Social History:  Social History   Substance and Sexual Activity  Alcohol Use Yes  . Alcohol/week: 5.0 - 6.0 standard drinks  . Types: 5 - 6 Cans of beer per week   Comment: "has drank for past 3 days" but "normally just on weekends"      Social History   Substance and Sexual Activity  Drug Use Yes  . Types: Marijuana    Additional Social History: Marital status: Single(just broke up with girlfriend of 1 1/2 years. toxic stressful relationship) Are you sexually active?: Yes What is your sexual orientation?: heterosexual Has your sexual activity been affected by drugs, alcohol, medication, or emotional stress?: n/a Does patient have children?: No(my ex is 36 and had 3 kids. "I was close to the youngest." )                         Allergies:  No Known Allergies Lab Results:  Results for orders placed or performed during the hospital encounter of 10/27/18 (from the past 48 hour(s))   Basic metabolic panel     Status: Abnormal   Collection Time: 10/27/18  3:02 AM  Result Value Ref Range   Sodium 141 135 - 145 mmol/L   Potassium 3.5 3.5 - 5.1 mmol/L   Chloride 108 98 - 111 mmol/L   CO2 21 (L) 22 - 32 mmol/L   Glucose, Bld 99 70 - 99 mg/dL   BUN 5 (L) 6 - 20 mg/dL   Creatinine, Ser 1.61 (L) 0.61 - 1.24 mg/dL   Calcium 9.5 8.9 - 09.6 mg/dL   GFR calc non Af Amer >60 >60 mL/min   GFR calc Af Amer >60 >60 mL/min   Anion gap 12 5 - 15    Comment: Performed at [facility], [address] [place], [place] [address]  CBC with Differential/Platelet     Status: Abnormal   Collection Time: 10/27/18  3:02 AM  Result Value Ref Range   WBC 11.3 (H) 4.0 - 10.5 K/uL   RBC 5.27 4.22 - 5.81 MIL/uL   Hemoglobin 16.4 13.0 - 17.0 g/dL  HCT 48.0 39.0 - 52.0 %   MCV 91.1 80.0 - 100.0 fL   MCH 31.1 26.0 - 34.0 pg   MCHC 34.2 30.0 - 36.0 g/dL   RDW 81.1 91.4 - 78.2 %   Platelets 252 150 - 400 K/uL   nRBC 0.0 0.0 - 0.2 %   Neutrophils Relative % 72 %   Neutro Abs 8.2 (H) 1.7 - 7.7 K/uL   Lymphocytes Relative 21 %   Lymphs Abs 2.4 0.7 - 4.0 K/uL   Monocytes Relative 4 %   Monocytes Absolute 0.5 0.1 - 1.0 K/uL   Eosinophils Relative 1 %   Eosinophils Absolute 0.1 0.0 - 0.5 K/uL   Basophils Relative 1 %   Basophils Absolute 0.1 0.0 - 0.1 K/uL   Immature Granulocytes 1 %   Abs Immature Granulocytes 0.06 0.00 - 0.07 K/uL    Comment: Performed at [facility], [address], [place], [place] [address]  Ethanol     Status: Abnormal   Collection Time: 10/27/18  3:02 AM  Result Value Ref Range   Alcohol, Ethyl (B) 10 (H) <10 mg/dL    Comment: (NOTE) Lowest detectable limit for serum alcohol is 10 mg/dL. For medical purposes only. Performed at [facility], [address] [place], [place] [address]     Blood Alcohol level:  Lab Results  Component Value Date   ETH 10 (H) 10/27/2018   ETH <10 10/25/2018    Metabolic Disorder Labs:  No results found for: HGBA1C, MPG No  results found for: PROLACTIN No results found for: CHOL, TRIG, HDL, CHOLHDL, VLDL, LDLCALC  Current Medications: Current Facility-Administered Medications  Medication Dose Route Frequency Provider Last Rate Last Dose  . acetaminophen (TYLENOL) tablet 650 mg  650 mg Oral Q6H PRN Nira Conn A, NP      . alum & mag hydroxide-simeth (MAALOX/MYLANTA) 200-200-20 MG/5ML suspension 30 mL  30 mL Oral Q4H PRN Nira Conn A, NP      . hydrOXYzine (ATARAX/VISTARIL) tablet 25 mg  25 mg Oral TID PRN Nira Conn A, NP   25 mg at 10/27/18 1257  . [START ON 10/28/2018] Influenza vac split quadrivalent PF (FLUARIX) injection 0.5 mL  0.5 mL Intramuscular Tomorrow-1000 Malvin Johns, MD      . magnesium hydroxide (MILK OF MAGNESIA) suspension 30 mL  30 mL Oral Daily PRN Nira Conn A, NP      . sertraline (ZOLOFT) tablet 25 mg  25 mg Oral Daily Antonieta Pert, MD      . traZODone (DESYREL) tablet 50 mg  50 mg Oral QHS PRN Jackelyn Poling, NP       PTA Medications: Medications Prior to Admission  Medication Sig Dispense Refill Last Dose  . omeprazole (PRILOSEC) 20 MG capsule Take 1 capsule by mouth 2 (two) times daily.  0     Musculoskeletal: Strength & Muscle Tone: within normal limits Gait & Station: normal Patient leans: N/A  Psychiatric Specialty Exam: Physical Exam  Nursing note and vitals reviewed. Constitutional: He is oriented to person, place, and time. He appears well-developed and well-nourished.  HENT:  Head: Normocephalic and atraumatic.  Respiratory: Effort normal.  Neurological: He is alert and oriented to person, place, and time.    ROS  Blood pressure 130/70, pulse 77, temperature 98.7 F (37.1 C), temperature source Oral, resp. rate 18, height 5\' 11"  (1.803 m), weight 111.6 kg, SpO2 97 %.Body mass index is 34.31 kg/m.  General Appearance: Disheveled  Eye Contact:  Fair  Speech:  Normal Rate  Volume:  Decreased  Mood:  Depressed  Affect:  Congruent  Thought Process:   Coherent and Descriptions of Associations: Intact  Orientation:  Full (Time, Place, and Person)  Thought Content:  Logical  Suicidal Thoughts:  Yes.  without intent/plan  Homicidal Thoughts:  No  Memory:  Immediate;   Fair Recent;   Fair Remote;   Fair  Judgement:  Intact  Insight:  Fair  Psychomotor Activity:  Psychomotor Retardation  Concentration:  Concentration: Fair and Attention Span: Fair  Recall:  [employer] of Knowledge:  Fair  Language:  Fair  Akathisia:  Negative  Handed:  Right  AIMS (if indicated):     Assets:  Communication Skills Desire for Improvement Housing Leisure Time Physical Health Resilience Social Support  ADL's:  Intact  Cognition:  WNL  Sleep:       Treatment Plan Summary: Daily contact with patient to assess and evaluate symptoms and progress in treatment, Medication management and Plan : Patient is seen and examined.  Patient is a 22 year old male with a past psychiatric history significant for major depression, cocaine use disorder, alcohol use disorder and cannabis use disorder as well as benzodiazepine use disorder.  He will be admitted to the hospital.  He will be integrated into the milieu.  He will be encouraged to attend groups.  He will be seen by social work.  He will be placed on 15-minute safety checks.  He will be monitored for withdrawal syndromes.  He will be started on Zoloft 25 mg p.o. daily.  He will have written for him hydroxyzine 25 mg every 6 hours as needed anxiety.  He will also have available trazodone 50 mg p.o. nightly as needed insomnia.  We will collect collateral information from his father.  Observation Level/Precautions:  Detox 15 minute checks  Laboratory:  Chemistry Profile  Psychotherapy:    Medications:    Consultations:    Discharge Concerns:    Estimated LOS:  Other:     Physician Treatment Plan for Primary Diagnosis: <principal problem not specified> Long Term Goal(s): Improvement in symptoms so as ready for  discharge  Short Term Goals: Ability to identify changes in lifestyle to reduce recurrence of condition will improve, Ability to verbalize feelings will improve, Ability to disclose and discuss suicidal ideas, Ability to demonstrate self-control will improve, Ability to identify and develop effective coping behaviors will improve, Ability to maintain clinical measurements within normal limits will improve and Ability to identify triggers associated with substance abuse/mental health issues will improve  Physician Treatment Plan for Secondary Diagnosis: Active Problems:   Severe major depression, single episode, without psychotic features (HCC)  Long Term Goal(s): Improvement in symptoms so as ready for discharge  Short Term Goals: Ability to identify changes in lifestyle to reduce recurrence of condition will improve, Ability to verbalize feelings will improve, Ability to disclose and discuss suicidal ideas, Ability to demonstrate self-control will improve, Ability to identify and develop effective coping behaviors will improve, Ability to maintain clinical measurements within normal limits will improve and Ability to identify triggers associated with substance abuse/mental health issues will improve  I certify that inpatient services furnished can reasonably be expected to improve the patient's condition.    Antonieta Pert, MD 12/4/20193:44 PM

## 2018-10-27 NOTE — Progress Notes (Deleted)
Recreation Therapy Notes  Date: 12.4.19 Time: 0930 Location: [address] Dayroom  Group Topic: Stress Management  Goal Area(s) Addresses:  Patient will verbalize importance of using healthy stress management.  Patient will identify positive emotions associated with healthy stress management.   Intervention: Stress Management  Activity :  Guided Imagery.  LRT introduced the stress management technique of guided imagery.  LRT read Chapman script that allowed patients to envision their peaceful place.  Patients were to follow along as script was read to engage in activity.  Education:  Stress Management, Discharge Planning.   Education Outcome: Acknowledges edcuation/In group clarification offered/Needs additional education  Clinical Observations/Feedback: Pt did not attend group.     Gabriel Chapman, LRT/CTRS         Gabriel Chapman 10/27/2018 11:23 AM 

## 2018-10-27 NOTE — Plan of Care (Signed)
  Problem: Coping: Goal: Ability to demonstrate self-control will improve Outcome: Progressing  D: Pt alert and oriented on the unit. Pt engaging with RN staff and other pts. Pt denies SI/HI, A/VH, and pain. Pt was asleep for most of the afternoon and was isolative in his room Pt is pleasant and cooperative. A: Education, support and encouragement provided, q15 minute safety checks remain in effect. Medications administered per MD orders. R: No reactions/side effects to medicine noted. Pt denies any concerns at this time, and verbally contracts for safety. Pt ambulating on the unit with no issues. Pt remains safe on and off the unit.
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
## 2018-10-27 NOTE — ED Triage Notes (Signed)
Pt here with his dad cause he states he is severely depressed since the breakup with his girlfriend; pt denies any specific plan to harm himself; pt was here for same complaint a few days ago with IVC paperwork taken out by his dad; pt admits to 5 mixed drinks today for the last 5 days; dad states pt told 3 times tonight he wished he were dead

## 2018-10-27 NOTE — ED Notes (Signed)
[facility]

## 2018-10-27 NOTE — ED Notes (Signed)
Pt placed [address] bed 1 BH, will be accepted after 9am.

## 2018-10-28 MED ORDER — NICOTINE 14 MG/24HR TD PT24
14.0000 mg | MEDICATED_PATCH | Freq: Every day | TRANSDERMAL | Status: DC
  Administered 2018-10-29: 14 mg via TRANSDERMAL
  Filled 2018-10-28 (×4): qty 1

## 2018-10-28 MED ORDER — SERTRALINE HCL 50 MG PO TABS
50.0000 mg | ORAL_TABLET | Freq: Every day | ORAL | Status: DC
  Administered 2018-10-29: 50 mg via ORAL
  Filled 2018-10-28 (×3): qty 1

## 2018-10-28 NOTE — Progress Notes (Signed)
Patient ID: Gabriel Chapman, male   DOB: Jun 26, 1996, 22 y.o.   MRN: [id number]  Nursing Progress Note [id number]  Data: Patient presents with flat affect and depressed mood. Patient compliant with scheduled medications. Patient denies pain/physical complaints. Patient completed self-inventory sheet and rates depression, hopelessness, and anxiety 3,0,5 respectively. Patient rates their sleep and appetite as good/good respectively. Patient states goal for today is to "be happy and open minded" and "maybe attend an AA meeting". Patient is isolative to his room. Patient currently denies SI/HI/AVH.   Action: Patient is educated about and provided medication per provider's orders. Patient safety maintained with q15 min safety checks and frequent rounding. Low fall risk precautions in place. Emotional support given. 1:1 interaction and active listening provided. Patient encouraged to attend meals, groups, and work on treatment plan and goals. Labs, vital signs and patient behavior monitored throughout shift.   Response: Patient remains safe on the unit at this time and agrees to come to staff with any issues/concerns. Will continue to support and monitor.

## 2018-10-28 NOTE — Progress Notes (Signed)
[facility] Group Notes:  (Nursing/MHT/Case Management/Adjunct)  Date:  10/28/2018  Time:  2045  Type of Therapy:  wrap up group  Participation Level:  Active  Participation Quality:  Appropriate, Attentive and Sharing  Affect:  Appropriate  Cognitive:  Appropriate  Insight:  Improving  Engagement in Group:  Engaged  Modes of Intervention:  Clarification, Education and Support  Summary of Progress/Problems: Pt reported his good for the day was seeing his Dad and Step-mom.  Pt shared that he wouldn't change anything about his life, that he believes everything is for a reason and depression got the best of him. Pt does report it is good that he started on medicine to help with his depression. Pt is grateful for his family.   Marcille BuffyMcNeil, Zira Helinski S 10/28/2018, 10:48 PM

## 2018-10-28 NOTE — Tx Team (Addendum)
Interdisciplinary Treatment and Diagnostic Plan Update  10/29/2018 Time of Session: 0830AM Gabriel Chapman MRN: [id number]  Principal Diagnosis: MDD, single episode, severe, without psychotic features  Secondary Diagnoses: Active Problems:   Severe major depression, single episode, without psychotic features (HCC)   Current Medications:  Current Facility-Administered Medications  Medication Dose Route Frequency Provider Last Rate Last Dose  . acetaminophen (TYLENOL) tablet 650 mg  650 mg Oral Q6H PRN Nira Conn A, NP      . alum & mag hydroxide-simeth (MAALOX/MYLANTA) 200-200-20 MG/5ML suspension 30 mL  30 mL Oral Q4H PRN Nira Conn A, NP      . hydrOXYzine (ATARAX/VISTARIL) tablet 25 mg  25 mg Oral TID PRN Jackelyn Poling, NP   25 mg at 10/28/18 1809  . Influenza vac split quadrivalent PF (FLUARIX) injection 0.5 mL  0.5 mL Intramuscular Tomorrow-1000 Malvin Johns, MD      . magnesium hydroxide (MILK OF MAGNESIA) suspension 30 mL  30 mL Oral Daily PRN Nira Conn A, NP      . nicotine (NICODERM CQ - dosed in mg/24 hours) patch 14 mg  14 mg Transdermal Daily Donell Sievert E, PA-C   14 mg at 10/29/18 4098  . sertraline (ZOLOFT) tablet 50 mg  50 mg Oral Daily Antonieta Pert, MD   50 mg at 10/29/18 1191  . traZODone (DESYREL) tablet 50 mg  50 mg Oral QHS PRN Jackelyn Poling, NP   50 mg at 10/28/18 2115   PTA Medications: Medications Prior to Admission  Medication Sig Dispense Refill Last Dose  . omeprazole (PRILOSEC) 20 MG capsule Take 1 capsule by mouth 2 (two) times daily.  0     Patient Stressors: Financial difficulties Occupational concerns Substance abuse Traumatic event  Patient Strengths: Average or above average intelligence Communication skills Physical Health Supportive family/friends  Treatment Modalities: Medication Management, Group therapy, Case management,  1 to 1 session with clinician, Psychoeducation, Recreational therapy.   Physician Treatment Plan  for Primary Diagnosis: MDD, single episode, severe, without psychotic features Long Term Goal(s): Improvement in symptoms so as ready for discharge Improvement in symptoms so as ready for discharge   Short Term Goals: Ability to identify changes in lifestyle to reduce recurrence of condition will improve Ability to verbalize feelings will improve Ability to disclose and discuss suicidal ideas Ability to demonstrate self-control will improve Ability to identify and develop effective coping behaviors will improve Ability to maintain clinical measurements within normal limits will improve Ability to identify triggers associated with substance abuse/mental health issues will improve Ability to identify changes in lifestyle to reduce recurrence of condition will improve Ability to verbalize feelings will improve Ability to disclose and discuss suicidal ideas Ability to demonstrate self-control will improve Ability to identify and develop effective coping behaviors will improve Ability to maintain clinical measurements within normal limits will improve Ability to identify triggers associated with substance abuse/mental health issues will improve  Medication Management: Evaluate patient's response, side effects, and tolerance of medication regimen.  Therapeutic Interventions: 1 to 1 sessions, Unit Group sessions and Medication administration.  Evaluation of Outcomes: Adequate for discharge   Physician Treatment Plan for Secondary Diagnosis: Active Problems:   Severe major depression, single episode, without psychotic features (HCC)  Long Term Goal(s): Improvement in symptoms so as ready for discharge Improvement in symptoms so as ready for discharge   Short Term Goals: Ability to identify changes in lifestyle to reduce recurrence of condition will improve Ability to verbalize feelings will  improve Ability to disclose and discuss suicidal ideas Ability to demonstrate self-control will  improve Ability to identify and develop effective coping behaviors will improve Ability to maintain clinical measurements within normal limits will improve Ability to identify triggers associated with substance abuse/mental health issues will improve Ability to identify changes in lifestyle to reduce recurrence of condition will improve Ability to verbalize feelings will improve Ability to disclose and discuss suicidal ideas Ability to demonstrate self-control will improve Ability to identify and develop effective coping behaviors will improve Ability to maintain clinical measurements within normal limits will improve Ability to identify triggers associated with substance abuse/mental health issues will improve     Medication Management: Evaluate patient's response, side effects, and tolerance of medication regimen.  Therapeutic Interventions: 1 to 1 sessions, Unit Group sessions and Medication administration.  Evaluation of Outcomes: Adequate for discharge  RN Treatment Plan for Primary Diagnosis: MDD, single episode, severe, without psychotic features Long Term Goal(s): Knowledge of disease and therapeutic regimen to maintain health will improve  Short Term Goals: Ability to remain free from injury will improve, Ability to verbalize frustration and anger appropriately will improve, Ability to demonstrate self-control and Ability to identify and develop effective coping behaviors will improve  Medication Management: RN will administer medications as ordered by provider, will assess and evaluate patient's response and provide education to patient for prescribed medication. RN will report any adverse and/or side effects to prescribing provider.  Therapeutic Interventions: 1 on 1 counseling sessions, Psychoeducation, Medication administration, Evaluate responses to treatment, Monitor vital signs and CBGs as ordered, Perform/monitor CIWA, COWS, AIMS and Fall Risk screenings as ordered, Perform  wound care treatments as ordered.  Evaluation of Outcomes: Adequate for discharge   LCSW Treatment Plan for Primary Diagnosis: MDD, single episode, severe, without psychotic features Long Term Goal(s): Safe transition to appropriate next level of care at discharge, Engage patient in therapeutic group addressing interpersonal concerns.  Short Term Goals: Engage patient in aftercare planning with referrals and resources, Increase emotional regulation, Facilitate patient progression through stages of change regarding substance use diagnoses and concerns and Identify triggers associated with mental health/substance abuse issues  Therapeutic Interventions: Assess for all discharge needs, 1 to 1 time with Social worker, Explore available resources and support systems, Assess for adequacy in community support network, Educate family and significant other(s) on suicide prevention, Complete Psychosocial Assessment, Interpersonal group therapy.  Evaluation of Outcomes: Adequate for discharge   Progress in Treatment: Attending groups: No. Pt remains isolative in room.  Participating in groups: No. Taking medication as prescribed: Yes. Toleration medication: Yes. Family/Significant other contact made: Yes, individual(s) contacted:  pts father to complete SPE and obtain collateral information. Patient understands diagnosis: Yes. Discussing patient identified problems/goals with staff: Yes. Medical problems stabilized or resolved: Yes. Denies suicidal/homicidal ideation: Yes. Issues/concerns per patient self-inventory: No. Other: n/a   New problem(s) identified: No, Describe:  n/a  New Short Term/Long Term Goal(s): detox, medication management for mood stabilization; elimination of SI thoughts; development of comprehensive mental wellness/sobriety plan.   Patient Goals:  "I need help coping with my depression. My girlfriend broke up with me."   Discharge Plan or Barriers: CSW assessing. Pt plans to  move in with father at discharge and has follow-up in place with his PCP. Pt declined additional referrals. [facility] pamphlet, Mobile Crisis information, and AA/NA information provided to patient for additional community support and resources.   Reason for Continuation of Hospitalization: none  Estimated Length of Stay: today, 10/29/18  Attendees:  Patient: 10/29/2018 10:57 AM  Physician: Dr. Jola Babinski MD 10/29/2018 10:57 AM  Nursing: Rodell Perna RN; Ethelene Browns RN 10/29/2018 10:57 AM  RN Care Manager:x 10/29/2018 10:57 AM  Social Worker: Corrie Mckusick LCSW 10/29/2018 10:57 AM  Recreational Therapist: x 10/29/2018 10:57 AM  Other: Armandina Stammer NP 10/29/2018 10:57 AM  Other:  10/29/2018 10:57 AM  Other: 10/29/2018 10:57 AM    Scribe for Treatment Team: Rona Ravens, LCSW 10/29/2018 10:57 AM

## 2018-10-28 NOTE — Progress Notes (Signed)
[facility] MD Progress Note  10/28/2018 12:19 PM MORSE BRUEGGEMANN  MRN:  [id number] Subjective:  Patient is seen and examined.  Patient is a 22 year old male with a past psychiatric history significant for cocaine use disorder, benzodiazepine use disorder, cannabis use disorder and depression who presented to the [facility] emergency department on 10/27/2018 with suicidal ideation.  Patient stated that he had had a recent break-up with a long-term relationship.   Objective: Patient is seen and examined.  Patient is a 22 year old male with the above-stated past psychiatric history seen in follow-up.  He is essentially unchanged today.  He again he is spending more time in his room and going to groups.  We discussed that.  He still continues to be upset about the break-up.  We collected collateral information from his father.  Apparently the woman is significantly older than the patient, and she has been manipulating him.  The father is trying to get the patient's belongings out of her home, and trying to stabilize the situation.  Per the father's description if the woman told him to walk into traffic to kill himself he would.  He continues to seek resolution of his grief over the loss of the relationship.  Discussed the loss as similar to being a death, and that he would have to go through the stages of recovery to resolve his grief over this.  He denied any suicidal ideation.  His vital signs have been stable, he is afebrile.  He is not showing any signs or symptoms of alcohol withdrawal syndromes.  Review of his laboratories revealed essentially normal labs except for his drug screen which was positive for benzodiazepines, cocaine as well as marijuana.  Principal Problem: <principal problem not specified> Diagnosis: Active Problems:   Severe major depression, single episode, without psychotic features (HCC)  Total Time spent with patient: 15 minutes  Past Psychiatric History: See admission H&P  Past Medical  History:  Past Medical History:  Diagnosis Date  . Heart murmur     Past Surgical History:  Procedure Laterality Date  . BACK SURGERY     Family History: History reviewed. No pertinent family history. Family Psychiatric  History: See admission H&P Social History:  Social History   Substance and Sexual Activity  Alcohol Use Yes  . Alcohol/week: 5.0 - 6.0 standard drinks  . Types: 5 - 6 Cans of beer per week   Comment: "has drank for past 3 days" but "normally just on weekends"      Social History   Substance and Sexual Activity  Drug Use Yes  . Types: Marijuana    Social History   Socioeconomic History  . Marital status: Single    Spouse name: Not on file  . Number of children: Not on file  . Years of education: Not on file  . Highest education level: Not on file  Occupational History  . Not on file  Social Needs  . Financial resource strain: Not on file  . Food insecurity:    Worry: Not on file    Inability: Not on file  . Transportation needs:    Medical: Not on file    Non-medical: Not on file  Tobacco Use  . Smoking status: Current Every Day Smoker    Packs/day: 0.25    Types: Cigarettes  . Smokeless tobacco: Current User    Types: Chew  Substance and Sexual Activity  . Alcohol use: Yes    Alcohol/week: 5.0 - 6.0 standard drinks  Types: 5 - 6 Cans of beer per week    Comment: "has drank for past 3 days" but "normally just on weekends"   . Drug use: Yes    Types: Marijuana  . Sexual activity: Not on file  Lifestyle  . Physical activity:    Days per week: Not on file    Minutes per session: Not on file  . Stress: Not on file  Relationships  . Social connections:    Talks on phone: Not on file    Gets together: Not on file    Attends religious service: Not on file    Active member of club or organization: Not on file    Attends meetings of clubs or organizations: Not on file    Relationship status: Not on file  Other Topics Concern  . Not on  file  Social History Narrative  . Not on file   Additional Social History:                         Sleep: Good  Appetite:  Fair  Current Medications: Current Facility-Administered Medications  Medication Dose Route Frequency Provider Last Rate Last Dose  . acetaminophen (TYLENOL) tablet 650 mg  650 mg Oral Q6H PRN Nira Conn A, NP      . alum & mag hydroxide-simeth (MAALOX/MYLANTA) 200-200-20 MG/5ML suspension 30 mL  30 mL Oral Q4H PRN Nira Conn A, NP      . hydrOXYzine (ATARAX/VISTARIL) tablet 25 mg  25 mg Oral TID PRN Nira Conn A, NP   25 mg at 10/28/18 0805  . Influenza vac split quadrivalent PF (FLUARIX) injection 0.5 mL  0.5 mL Intramuscular Tomorrow-1000 Malvin Johns, MD      . magnesium hydroxide (MILK OF MAGNESIA) suspension 30 mL  30 mL Oral Daily PRN Nira Conn A, NP      . nicotine (NICODERM CQ - dosed in mg/24 hours) patch 14 mg  14 mg Transdermal Daily Simon, Spencer E, PA-C      . sertraline (ZOLOFT) tablet 25 mg  25 mg Oral Daily Antonieta Pert, MD   25 mg at 10/28/18 0805  . traZODone (DESYREL) tablet 50 mg  50 mg Oral QHS PRN Jackelyn Poling, NP   50 mg at 10/27/18 2143    Lab Results:  Results for orders placed or performed during the hospital encounter of 10/27/18 (from the past 48 hour(s))  Basic metabolic panel     Status: Abnormal   Collection Time: 10/27/18  3:02 AM  Result Value Ref Range   Sodium 141 135 - 145 mmol/L   Potassium 3.5 3.5 - 5.1 mmol/L   Chloride 108 98 - 111 mmol/L   CO2 21 (L) 22 - 32 mmol/L   Glucose, Bld 99 70 - 99 mg/dL   BUN 5 (L) 6 - 20 mg/dL   Creatinine, Ser 2.13 (L) 0.61 - 1.24 mg/dL   Calcium 9.5 8.9 - 08.6 mg/dL   GFR calc non Af Amer >60 >60 mL/min   GFR calc Af Amer >60 >60 mL/min   Anion gap 12 5 - 15    Comment: Performed at [facility], [address], [place], [place] [address]  CBC with Differential/Platelet     Status: Abnormal   Collection Time: 10/27/18  3:02 AM  Result Value Ref Range    WBC 11.3 (H) 4.0 - 10.5 K/uL   RBC 5.27 4.22 - 5.81 MIL/uL   Hemoglobin 16.4 13.0 -  17.0 g/dL   HCT 96.048.0 45.439.0 - 09.852.0 %   MCV 91.1 80.0 - 100.0 fL   MCH 31.1 26.0 - 34.0 pg   MCHC 34.2 30.0 - 36.0 g/dL   RDW 11.911.8 14.711.5 - 82.915.5 %   Platelets 252 150 - 400 K/uL   nRBC 0.0 0.0 - 0.2 %   Neutrophils Relative % 72 %   Neutro Abs 8.2 (H) 1.7 - 7.7 K/uL   Lymphocytes Relative 21 %   Lymphs Abs 2.4 0.7 - 4.0 K/uL   Monocytes Relative 4 %   Monocytes Absolute 0.5 0.1 - 1.0 K/uL   Eosinophils Relative 1 %   Eosinophils Absolute 0.1 0.0 - 0.5 K/uL   Basophils Relative 1 %   Basophils Absolute 0.1 0.0 - 0.1 K/uL   Immature Granulocytes 1 %   Abs Immature Granulocytes 0.06 0.00 - 0.07 K/uL    Comment: Performed at [facility], [address] [place], [place] [address]  Ethanol     Status: Abnormal   Collection Time: 10/27/18  3:02 AM  Result Value Ref Range   Alcohol, Ethyl (B) 10 (H) <10 mg/dL    Comment: (NOTE) Lowest detectable limit for serum alcohol is 10 mg/dL. For medical purposes only. Performed at [facility], [address] [place], [place] [address]     Blood Alcohol level:  Lab Results  Component Value Date   ETH 10 (H) 10/27/2018   ETH <10 10/25/2018    Metabolic Disorder Labs: No results found for: HGBA1C, MPG No results found for: PROLACTIN No results found for: CHOL, TRIG, HDL, CHOLHDL, VLDL, LDLCALC  Physical Findings: AIMS: Facial and Oral Movements Muscles of Facial Expression: None, normal Lips and Perioral Area: None, normal Jaw: None, normal Tongue: None, normal,Extremity Movements Upper (arms, wrists, hands, fingers): None, normal Lower (legs, knees, ankles, toes): None, normal, Trunk Movements Neck, shoulders, hips: None, normal, Overall Severity Severity of abnormal movements (highest score from questions above): None, normal Incapacitation due to abnormal movements: None, normal Patient's awareness of abnormal movements (rate only patient's  report): No Awareness, Dental Status Current problems with teeth and/or dentures?: No Does patient usually wear dentures?: No  CIWA:    COWS:     Musculoskeletal: Strength & Muscle Tone: within normal limits Gait & Station: normal Patient leans: N/A  Psychiatric Specialty Exam: Physical Exam  Nursing note and vitals reviewed. Constitutional: He is oriented to person, place, and time. He appears well-developed and well-nourished.  HENT:  Head: Normocephalic and atraumatic.  Respiratory: Effort normal.  Neurological: He is alert and oriented to person, place, and time.    ROS  Blood pressure 120/80, pulse 100, temperature 98.7 F (37.1 C), temperature source Oral, resp. rate 18, height 5\' 11"  (1.803 m), weight 111.6 kg, SpO2 97 %.Body mass index is 34.31 kg/m.  General Appearance: Disheveled  Eye Contact:  Fair  Speech:  Normal Rate  Volume:  Decreased  Mood:  Depressed  Affect:  Congruent  Thought Process:  Coherent and Descriptions of Associations: Intact  Orientation:  Full (Time, Place, and Person)  Thought Content:  Logical  Suicidal Thoughts:  No  Homicidal Thoughts:  No  Memory:  Immediate;   Fair Recent;   Fair Remote;   Fair  Judgement:  Intact  Insight:  Lacking  Psychomotor Activity:  Normal  Concentration:  Concentration: Fair and Attention Span: Fair  Recall:  [employer]Fair  Fund of Knowledge:  Fair  Language:  Fair  Akathisia:  Negative  Handed:  Right  AIMS (if indicated):     Assets:  Desire for Improvement Financial Resources/Insurance Housing Physical Health Resilience Social Support  ADL's:  Intact  Cognition:  WNL  Sleep:  Number of Hours: 6.25     Treatment Plan Summary: Daily contact with patient to assess and evaluate symptoms and progress in treatment, Medication management and Plan : Patient is seen and examined.  Patient is a 22 year old male with a past psychiatric history significant for major depression, cocaine use disorder, alcohol use  disorder and cannabis use disorder as well as benzodiazepine use disorder.  He is essentially unchanged.  Remains depressed and dysphoric.  He has not showing any signs or symptoms of withdrawal likely.  No change in his medications.  We will continue to monitor the situation given the gravity of the circumstances at home. 1.  Increase sertraline to 50 mg p.o. daily. 2.  Continue hydroxyzine 25 mg p.o. 3 times daily as needed anxiety. 3.  Continue trazodone 50 mg p.o. nightly as needed for insomnia. Continue to monitor for withdrawal syndromes. 4.  Attempt to engage patient into cognitive therapy to assist with resolution of loss the relationship. 5.  Disposition planning-in progress  Antonieta Pert, MD 10/28/2018, 12:19 PM

## 2018-10-28 NOTE — [facility] Suicide Risk Assessment (Signed)
[facility] INPATIENT:  Family/Significant Other Suicide Prevention Education  Suicide Prevention Education:  Education Completed; Gabriel Chapman (pt's father) [phone] has been identified by the patient as the family member/significant other with whom the patient will be residing, and identified as the person(s) who will aid the patient in the event of a mental health crisis (suicidal ideations/suicide attempt).  With written consent from the patient, the family member/significant other has been provided the following suicide prevention education, prior to the and/or following the discharge of the patient.  The suicide prevention education provided includes the following:  Suicide risk factors  Suicide prevention and interventions  National Suicide Hotline telephone number  [facility] assessment telephone number  [facility] Emergency Assistance 911  [facility] and/or Residential Mobile Crisis Unit telephone number  Request made of family/significant other to:  Remove weapons (e.g., guns, rifles, knives), all items previously/currently identified as safety concern.    Remove drugs/medications (over-the-counter, prescriptions, illicit drugs), all items previously/currently identified as a safety concern.  The family member/significant other verbalizes understanding of the suicide prevention education information provided.  The family member/significant other agrees to remove the items of safety concern listed above.  Pt's father is very concerned regarding pt's "toxic relationship with his ex." Pt's father is requesting a few more days in the hospital in order for him to get patient's belongings out of girlfriend's possession and into his home, where pt will be living at discharge. "the longer he is away from that situation, the better his chances of not going back." SPE and aftercare reviewed with pt's father.   Gabriel RavensHeather S Adrine Hayworth LCSW 10/28/2018, 12:23 PM

## 2018-10-28 NOTE — [facility] Group Notes (Signed)
Adult Nursing/[facility] Psychoeducational Group Note  Date:  10/28/2018  Time: 4:00 PM  Group Topic/Focus: Personal Choices and Values The focus of this group is to help patients assess and explore the importance of values in their lives, how their values affect their decisions, how they express their values and what opposes their expression. Lead By: Alyssa J. RN, and Michael L, MHT  Participation Level:  Did Not Attend  Additional Comments:  The patients were provided a beach ball with various questions. Patients took turn tossing the ball and answering questions while engaging in socialization and discussion.   Patient was invited but declined to attend group.  Gabriel Chapman [name] 10/28/2018 5:00 PM 

## 2018-10-28 NOTE — Progress Notes (Signed)
Pt is new to the unit earlier in the day.  Pt reports he is feeling ok since he has been on the unit for a little while.  He was in bed at the beginning of the shift, but got up and went to the dayroom after 2100.  He did not attend evening group.  He denies SI/HI/AVH at this time, but is still very depressed/sad.  He voiced no needs or concerns other than getting medication for anxiety and sleep at bedtime which he was given.  Pt has been pleasant and appropriate.  Support and encouragement offered.  Discharge plans are in process.  Safety maintained with q15 minute checks.

## 2018-10-28 NOTE — Progress Notes (Signed)
D: Pt was in dayroom upon initial approach.  Pt presents with appropriate affect and mood.  He describes his day as "pretty good" and reports goal is "trying to get out of here tomorrow."  Pt reports he feels safe to discharge tomorrow.  Pt denies SI/HI, denies hallucinations, denies pain.  Pt has been visible in milieu interacting with peers and staff appropriately.  Pt attended evening group.    A: Introduced self to pt.  Met with pt 1:1.  Actively listened to pt and offered support and encouragement.  PRN medication administered for sleep.  Q15 minute safety checks maintained.  R: Pt is safe on the unit.  Pt is compliant with medication.  Pt verbally contracts for safety.  Will continue to monitor and assess.

## 2018-10-29 DIAGNOSIS — F1920 Other psychoactive substance dependence, uncomplicated: Secondary | ICD-10-CM

## 2018-10-29 MED ORDER — NICOTINE 14 MG/24HR TD PT24: 14.0000 mg | MEDICATED_PATCH | Freq: Every day | TRANSDERMAL | 0 refills | Status: DC

## 2018-10-29 MED ORDER — TRAZODONE HCL 50 MG PO TABS: 50.0000 mg | ORAL_TABLET | Freq: Every evening | ORAL | 0 refills | Status: DC | PRN

## 2018-10-29 MED ORDER — SERTRALINE HCL 50 MG PO TABS: 50.0000 mg | ORAL_TABLET | Freq: Every day | ORAL | 0 refills | Status: DC

## 2018-10-29 MED ORDER — HYDROXYZINE HCL 25 MG PO TABS: 25.0000 mg | ORAL_TABLET | Freq: Three times a day (TID) | ORAL | 0 refills | Status: DC | PRN

## 2018-10-29 NOTE — Progress Notes (Signed)
Pt denies SI/HI. Pt verbalized readiness for discharge. Pt received both written and verbal discharge instructions. Pt verbalized understanding of discharge instructions. Pt agreed to f/u appt and med regimen. Pt received SRA, AVS, transitional record and prescriptions. Pt gathered belongings from room and locker. Pt safely discharged to the lobby.  

## 2018-10-29 NOTE — B[facility] Suicide Risk Assessment (Signed)
[facility] Discharge Suicide Risk Assessment   Principal Problem: <principal problem not specified> Discharge Diagnoses: Active Problems:   Severe major depression, single episode, without psychotic features (HCC)   Total Time spent with patient: 15 minutes  Musculoskeletal: Strength & Muscle Tone: within normal limits Gait & Station: normal Patient leans: N/A  Psychiatric Specialty Exam: Review of Systems  All other systems reviewed and are negative.   Blood pressure 131/71, pulse 92, temperature 98.7 F (37.1 C), temperature source Oral, resp. rate 20, height 5\' 11"  (1.803 m), weight 111.6 kg, SpO2 97 %.Body mass index is 34.31 kg/m.  General Appearance: Casual  Eye Contact::  Good  Speech:  Normal Rate409  Volume:  Normal  Mood:  Euthymic  Affect:  Congruent  Thought Process:  Coherent and Descriptions of Associations: Intact  Orientation:  Full (Time, Place, and Person)  Thought Content:  Logical  Suicidal Thoughts:  No  Homicidal Thoughts:  No  Memory:  Immediate;   Fair Recent;   Fair Remote;   Fair  Judgement:  Fair  Insight:  Fair  Psychomotor Activity:  Normal  Concentration:  Good  Recall:  Good  Fund of Knowledge:Good  Language: Good  Akathisia:  Negative  Handed:  Right  AIMS (if indicated):     Assets:  Communication Skills Desire for Improvement Financial Resources/Insurance Housing Leisure Time Physical Health Resilience Social Support  Sleep:  Number of Hours: 6.25  Cognition: WNL  ADL's:  Intact   Mental Status Per Nursing Assessment::   On Admission:  Self-harm thoughts  Demographic Factors:  Male, Adolescent or young adult, Caucasian and Low socioeconomic status  Loss Factors: Loss of significant relationship  Historical Factors: Impulsivity  Risk Reduction Factors:   Sense of responsibility to family, Living with another person, especially a relative and Positive social support  Continued Clinical Symptoms:  Depression:    Impulsivity Alcohol/Substance Abuse/Dependencies  Cognitive Features That Contribute To Risk:  None    Suicide Risk:  Minimal: No identifiable suicidal ideation.  Patients presenting with no risk factors but with morbid ruminations; may be classified as minimal risk based on the severity of the depressive symptoms  Follow-up Information    [facility]. Go on 11/12/2018.   Why:  Your hospital follow up appointment is Friday, 11/12/18 at 1:00p.  Please bring: photo ID, proof of insurance, and any discharge paperwork from this hospitalization.  Contact information: [address]  [place] [address]  Phone: [phone] Fax: [phone]          Plan Of Care/Follow-up recommendations:  Activity:  ad lib  Antonieta PertGreg Lawson Klarissa Mcilvain, MD 10/29/2018, 10:01 AM

## 2018-10-29 NOTE — Discharge Summary (Signed)
Physician Discharge Summary Note  Patient:  Gabriel Chapman is an 22 y.o., male MRN:  [id number] DOB:  Mar 15, 1996 Patient phone:  [phone] (home)  Patient address:   [address] [place] [address],  Total Time spent with patient: Greater than 30 minutes  Date of Admission:  10/27/2018  Date of Discharge: 10-29-18  Reason for Admission: Worsening depression & suicidal thoughts over a relationship break-up.  Principal Problem: Polysubstance (excluding opioids) dependence, daily use (HCC)  Discharge Diagnoses: Principal Problem:   Polysubstance (excluding opioids) dependence, daily use (HCC) Active Problems:   Severe major depression, single episode, without psychotic features (HCC)  Past Psychiatric History: Polysubstance use disorders, Mdd.  Past Medical History:  Past Medical History:  Diagnosis Date  . Heart murmur     Past Surgical History:  Procedure Laterality Date  . BACK SURGERY     Family History: History reviewed. No pertinent family history.  Family Psychiatric  History: See H&P  Social History:  Social History   Substance and Sexual Activity  Alcohol Use Yes  . Alcohol/week: 5.0 - 6.0 standard drinks  . Types: 5 - 6 Cans of beer per week   Comment: "has drank for past 3 days" but "normally just on weekends"      Social History   Substance and Sexual Activity  Drug Use Yes  . Types: Marijuana    Social History   Socioeconomic History  . Marital status: Single    Spouse name: Not on file  . Number of children: Not on file  . Years of education: Not on file  . Highest education level: Not on file  Occupational History  . Not on file  Social Needs  . Financial resource strain: Not on file  . Food insecurity:    Worry: Not on file    Inability: Not on file  . Transportation needs:    Medical: Not on file    Non-medical: Not on file  Tobacco Use  . Smoking status: Current Every Day Smoker    Packs/day: 0.25    Types: Cigarettes  .  Smokeless tobacco: Current User    Types: Chew  Substance and Sexual Activity  . Alcohol use: Yes    Alcohol/week: 5.0 - 6.0 standard drinks    Types: 5 - 6 Cans of beer per week    Comment: "has drank for past 3 days" but "normally just on weekends"   . Drug use: Yes    Types: Marijuana  . Sexual activity: Not on file  Lifestyle  . Physical activity:    Days per week: Not on file    Minutes per session: Not on file  . Stress: Not on file  Relationships  . Social connections:    Talks on phone: Not on file    Gets together: Not on file    Attends religious service: Not on file    Active member of club or organization: Not on file    Attends meetings of clubs or organizations: Not on file    Relationship status: Not on file  Other Topics Concern  . Not on file  Social History Narrative  . Not on file   Hospital Course: (Per Md's admission evaluation): Patient is a 22 year old male with a past psychiatric history significant for cocaine use disorder, benzodiazepine use disorder, cannabis use disorder and depression who presented to the [facility] emergency department on 10/27/2018 with suicidal ideation.Patient stated that he had had a recent break-up with a long-term  relationship.This occurred approximately 1 to 2 weeks ago. He stated that he was having a hard time getting over the relationship. He stated that prior to the relationship he had used cocaine significantly, but has been sober while he had been with her. He also found out after the break-up that she was involved with a previous relationship male. After the break-up he began to feel depressed, isolated, lonely, loss of appetite and poor sleeping. He began to drink alcohol heavily, used cocaine, and took some Xanax from a friend to try and cope with his emotional discontent. He presented to the emergency room with his father with these depressive symptoms seeking treatment. The patient stated that he had not seen a  psychiatrist before. He had not been in substance abuse rehabilitation in the past, and had never been in detox. He does have a DUI, and his court date is in January 2020. He is currently unemployed. He lost his job 1 to 2 weeks ago. He is currently living with his father. He admitted to helplessness, hopelessness and worthlessness. He admitted suicidal ideation. He was admitted to the hospital for evaluation and stabilization.  After the above admission assessment, Mcdonald's presenting symptoms were noted. He cited that his worsening depression & suicidal ideations were triggered by a recent relationship break-up. He stated on admission that he is having a hard time getting over it. He was also noted to be abusing substances, evidenced by his UDS positive for Benzodiazepine, Cocaine & THC. His toxicology result showed a BAL of 10 ^. He was admitted for mood stabilization treatments.  Although with his uDS positive for Benzodiazepine, Cocaine, THC & a BAL of 10 ^, Kolten did not receive any detoxification treatments. This is because he presented no substance withdrawal symptoms throughout his hospital stay. He was however medicated for his depressive symptoms. He received & was discharged on; Vistaril 25 mg prn for anxiety, Nicotine patch 14 mg for smoking cessation, Sertraline 50 mg for depression & Trazodone 50 mg prn for insomnia. He was enrolled & participated in the group counseling sessions being offered & held on this unit. He learned coping skills. He presented no other significant pre-existing medical issues that required treatments. He tolerated his treatment regimen without any adverse effects or reactions reported.  Solan's symptoms responded well to his treatment regimen. This is evidenced by his reports of improved mood, absence of suicidal ideation & presentation of good affect. He is currently mentally stable for discharge to continue mental health care on an outpatient basis as noted  below. He was provided with all the necessary information needed to make this appointment without problems.  Upon discharge, Rodolph BongBrant adamantly denies any suicidal/homicidal ideations, auditory/visual hallucinations, delusional thoughts or paranoia. [facility] in no apparent distress. In the event of worsening symptoms, patient is instructed/encouraged to call the crisis hotline, 911, go to the nearest ED or [facility] for appropriate evaluation and treatment of his worsening symptoms.   Physical Findings: AIMS: Facial and Oral Movements Muscles of Facial Expression: None, normal Lips and Perioral Area: None, normal Jaw: None, normal Tongue: None, normal,Extremity Movements Upper (arms, wrists, hands, fingers): None, normal Lower (legs, knees, ankles, toes): None, normal, Trunk Movements Neck, shoulders, hips: None, normal, Overall Severity Severity of abnormal movements (highest score from questions above): None, normal Incapacitation due to abnormal movements: None, normal Patient's awareness of abnormal movements (rate only patient's report): No Awareness, Dental Status Current problems with teeth and/or  dentures?: No Does patient usually wear dentures?: No  CIWA:    COWS:     Musculoskeletal: Strength & Muscle Tone: within normal limits Gait & Station: normal Patient leans: N/A  Psychiatric Specialty Exam: Physical Exam  Nursing note and vitals reviewed. Constitutional: He appears well-developed.  HENT:  Head: Normocephalic.  Eyes: Pupils are equal, round, and reactive to light.  Neck: Normal range of motion.  Cardiovascular: Normal rate.  Respiratory: Effort normal.  GI: Soft.  Genitourinary:  Genitourinary Comments: Deferred  Musculoskeletal: Normal range of motion.  Neurological: He is alert.  Skin: Skin is warm.    Review of Systems  Constitutional: Negative.   HENT: Negative.   Eyes: Negative.   Respiratory: Negative.   Negative for cough and shortness of breath.   Cardiovascular: Negative.  Negative for chest pain and palpitations.  Gastrointestinal: Negative.  Negative for heartburn, nausea and vomiting.  Genitourinary: Negative.   Musculoskeletal: Negative.   Skin: Negative.   Neurological: Negative.   Endo/Heme/Allergies: Negative.   Psychiatric/Behavioral: Positive for depression (Stable) and substance abuse (Hx. Alcohol, Benzo, Cocaine & THC use disorders ). Negative for hallucinations, memory loss and suicidal ideas. The patient has insomnia (Stable). The patient is not nervous/anxious (Stable).     Blood pressure 131/71, pulse 92, temperature 98.7 F (37.1 C), temperature source Oral, resp. rate 20, height 5\' 11"  (1.803 m), weight 111.6 kg, SpO2 97 %.Body mass index is 34.31 kg/m.  See Md's discharge SRA   Have you used any form of tobacco in the last 30 days? (Cigarettes, Smokeless Tobacco, Cigars, and/or Pipes): Yes  Has this patient used any form of tobacco in the last 30 days? (Cigarettes, Smokeless Tobacco, Cigars, and/or Pipes): Yes, an FDA-approved tobacco cessation medication was offered at discharge.  Blood Alcohol level:  Lab Results  Component Value Date   ETH 10 (H) 10/27/2018   ETH <10 10/25/2018   Metabolic Disorder Labs:  No results found for: HGBA1C, MPG No results found for: PROLACTIN No results found for: CHOL, TRIG, HDL, CHOLHDL, VLDL, LDLCALC  See Psychiatric Specialty Exam and Suicide Risk Assessment completed by Attending Physician prior to discharge.  Discharge destination:  Home  Is patient on multiple antipsychotic therapies at discharge:  No   Has Patient had three or more failed trials of antipsychotic monotherapy by history:  No  Recommended Plan for Multiple Antipsychotic Therapies: NA  Allergies as of 10/29/2018   No Known Allergies     Medication List    STOP taking these medications   omeprazole 20 MG capsule Commonly known as:  PRILOSEC      TAKE these medications     Indication  hydrOXYzine 25 MG tablet Commonly known as:  ATARAX/VISTARIL Take 1 tablet (25 mg total) by mouth 3 (three) times daily as needed for anxiety.  Indication:  Feeling Anxious   nicotine 14 mg/24hr patch Commonly known as:  NICODERM CQ - dosed in mg/24 hours Place 1 patch (14 mg total) onto the skin daily. (May purchase from over the counter): For smoking cessation  Indication:  Nicotine Addiction   sertraline 50 MG tablet Commonly known as:  ZOLOFT Take 1 tablet (50 mg total) by mouth daily. For depression  Indication:  Major Depressive Disorder   traZODone 50 MG tablet Commonly known as:  DESYREL Take 1 tablet (50 mg total) by mouth at bedtime as needed for sleep.  Indication:  Trouble Sleeping      Follow-up Information    [facility]. Go  on 11/12/2018.   Why:  Your hospital follow up appointment is Friday, 11/12/18 at 1:00p.  Please bring: photo ID, proof of insurance, and any discharge paperwork from this hospitalization.  Contact information: [address]  [place] [address]  Phone: [phone] Fax: [phone]         Follow-up recommendations: Activity:  As tolerated Diet: As recommended by your primary care doctor. Keep all scheduled follow-up appointments as recommended.   Comments: Patient is instructed prior to discharge to: Take all medications as prescribed by his/her mental healthcare provider. Report any adverse effects and or reactions from the medicines to his/her outpatient provider promptly. Patient has been instructed & cautioned: To not engage in alcohol and or illegal drug use while on prescription medicines. In the event of worsening symptoms, patient is instructed to call the crisis hotline, 911 and or go to the nearest ED for appropriate evaluation and treatment of symptoms. To follow-up with his/her primary care provider for your other medical issues, concerns and or health care needs.    Signed: Armandina Stammer, NP, PMHNP, FNP-BC 11/01/2018, 1:30 PM

## 2018-10-29 NOTE — Progress Notes (Signed)
  [facility] Discharge [facility] :  Will you be returning to the same living situation after discharge:  Yes,  home with dad At discharge, do you have transportation home?: Yes,  pt's father coming to pick him up around 1pm today. Do you have the ability to pay for your medications: Yes,  [employer]  Release of information consent forms completed and submitted to medical records by CSW.   Patient to Follow up at: Follow-up Information    [facility]. Go on 11/12/2018.   Why:  Your hospital follow up appointment is Friday, 11/12/18 at 1:00p.  Please bring: photo ID, proof of insurance, and any discharge paperwork from this hospitalization.  Contact information: [address]  [place] [address]  Phone: [phone] Fax: [phone]          Next level of care provider has access to [facility] Link:no  Safety Planning and Suicide Prevention discussed: Yes,  SPE completed with pt and his father. SPI pamphlet and mobile crisis information provided to pt.  Have you used any form of tobacco in the last 30 days? (Cigarettes, Smokeless Tobacco, Cigars, and/or Pipes): Yes  Has patient been referred to the Quitline?: Patient refused referral  Patient has been referred for addiction treatment: Yes  Rona RavensHeather S Mohamadou Maciver, LCSW 10/29/2018, 9:58 AM

## 2018-12-29 DIAGNOSIS — S39012A Strain of muscle, fascia and tendon of lower back, initial encounter: Secondary | ICD-10-CM | POA: Diagnosis not present

## 2018-12-29 DIAGNOSIS — M545 Low back pain: Secondary | ICD-10-CM | POA: Diagnosis not present

## 2018-12-30 ENCOUNTER — Other Ambulatory Visit: Payer: Self-pay

## 2018-12-30 ENCOUNTER — Encounter (HOSPITAL_COMMUNITY): Payer: Self-pay | Admitting: Emergency Medicine

## 2018-12-30 ENCOUNTER — Emergency Department (HOSPITAL_COMMUNITY)
Admission: EM | Admit: 2018-12-30 | Discharge: 2018-12-30 | Disposition: A | Discharge status: Home / Self Care | Payer: 59 | Attending: Emergency Medicine | Admitting: Emergency Medicine

## 2018-12-30 DIAGNOSIS — Y999 Unspecified external cause status: Secondary | ICD-10-CM | POA: Diagnosis not present

## 2018-12-30 DIAGNOSIS — Z79899 Other long term (current) drug therapy: Secondary | ICD-10-CM | POA: Diagnosis not present

## 2018-12-30 DIAGNOSIS — F17210 Nicotine dependence, cigarettes, uncomplicated: Secondary | ICD-10-CM | POA: Insufficient documentation

## 2018-12-30 DIAGNOSIS — S39012A Strain of muscle, fascia and tendon of lower back, initial encounter: Secondary | ICD-10-CM

## 2018-12-30 DIAGNOSIS — Y93B2 Activity, push-ups, pull-ups, sit-ups: Secondary | ICD-10-CM | POA: Diagnosis not present

## 2018-12-30 DIAGNOSIS — Y9239 Other specified sports and athletic area as the place of occurrence of the external cause: Secondary | ICD-10-CM | POA: Insufficient documentation

## 2018-12-30 DIAGNOSIS — S3992XA Unspecified injury of lower back, initial encounter: Secondary | ICD-10-CM | POA: Diagnosis present

## 2018-12-30 DIAGNOSIS — X500XXA Overexertion from strenuous movement or load, initial encounter: Secondary | ICD-10-CM | POA: Insufficient documentation

## 2018-12-30 DIAGNOSIS — M545 Low back pain: Secondary | ICD-10-CM | POA: Diagnosis not present

## 2018-12-30 HISTORY — DX: Other specified postprocedural states: Z98.890

## 2018-12-30 MED ORDER — KETOROLAC TROMETHAMINE 60 MG/2ML IM SOLN
60.0000 mg | Freq: Once | INTRAMUSCULAR | Status: AC
  Administered 2018-12-30: 60 mg via INTRAMUSCULAR
  Filled 2018-12-30: qty 2

## 2018-12-30 MED ORDER — HYDROCODONE-ACETAMINOPHEN 5-325 MG PO TABS: 1.0000 | ORAL_TABLET | Freq: Four times a day (QID) | ORAL | 0 refills | Status: DC | PRN

## 2018-12-30 NOTE — ED Provider Notes (Signed)
[facility] EMERGENCY DEPARTMENT Provider Note   CSN: [id number] Arrival date & time: 12/30/18  0109     History   Chief Complaint Chief Complaint  Patient presents with  . Back Pain    HPI Gabriel Chapman is a 23 y.o. male.  Patient is a 23 year old male with history of prior back surgery.  He presents today with complaints of low back pain.  He states he has been at the gym doing exercises and injured his low back doing sit ups.  He complains of pain in his lower back that radiates into his legs.  There are no bowel or bladder complaints.  There is no weakness or numbness.  Pain is worse with movement and palpation.  He is tried 1 of his girlfriends Flexeril tablets, however this did not help.  The history is provided by the patient.  Back Pain  Location:  Lumbar spine Quality:  Stabbing Radiates to: Both legs. Pain severity:  Moderate Pain is:  Same all the time Onset quality:  Sudden Duration:  2 days Timing:  Constant Progression:  Worsening Chronicity:  New   Past Medical History:  Diagnosis Date  . Heart murmur   . History of back surgery    3 years     Patient Active Problem List   Diagnosis Date Noted  . Polysubstance (excluding opioids) dependence, daily use (HCC) 10/29/2018  . Severe major depression, single episode, without psychotic features (HCC) 10/27/2018  . Cocaine abuse (HCC) 10/25/2018  . Nausea and vomiting 10/11/2015  . Enteritis 10/11/2015  . ETOH abuse 10/11/2015  . Tobacco abuse 10/11/2015  . Leukocytosis 10/11/2015    Past Surgical History:  Procedure Laterality Date  . BACK SURGERY          Home Medications    Prior to Admission medications   Medication Sig Start Date End Date Taking? Authorizing Provider  hydrOXYzine (ATARAX/VISTARIL) 25 MG tablet Take 1 tablet (25 mg total) by mouth 3 (three) times daily as needed for anxiety. 10/29/18   Armandina StammerNwoko, Agnes I, NP  nicotine (NICODERM CQ - DOSED IN MG/24 HOURS) 14 mg/24hr patch Place  1 patch (14 mg total) onto the skin daily. (May purchase from over the counter): For smoking cessation 10/30/18   Armandina StammerNwoko, Agnes I, NP  sertraline (ZOLOFT) 50 MG tablet Take 1 tablet (50 mg total) by mouth daily. For depression 10/30/18   Armandina StammerNwoko, Agnes I, NP  traZODone (DESYREL) 50 MG tablet Take 1 tablet (50 mg total) by mouth at bedtime as needed for sleep. 10/29/18   Sanjuana KavaNwoko, Agnes I, NP    Family History No family history on file.  Social History Social History   Tobacco Use  . Smoking status: Current Every Day Smoker    Packs/day: 0.25    Types: Cigarettes  . Smokeless tobacco: Current User    Types: Chew  Substance Use Topics  . Alcohol use: Yes    Alcohol/week: 5.0 - 6.0 standard drinks    Types: 5 - 6 Cans of beer per week    Comment: "has drank for past 3 days" but "normally just on weekends"   . Drug use: Yes    Types: Marijuana     Allergies   Patient has no known allergies.   Review of Systems Review of Systems  Musculoskeletal: Positive for back pain.  All other systems reviewed and are negative.    Physical Exam Updated Vital Signs BP 132/82 (BP Location: Left Arm)   Pulse 72  Temp 97.6 F (36.4 C) (Oral)   Ht 5\' 8"  (1.727 m)   Wt 107.5 kg   SpO2 98%   BMI 36.04 kg/m   Physical Exam Vitals signs and nursing note reviewed.  Constitutional:      Appearance: Normal appearance.  HENT:     Head: Normocephalic and atraumatic.  Pulmonary:     Effort: Pulmonary effort is normal.  Musculoskeletal:     Comments: There is tenderness to palpation in the soft tissues of the lumbar region.  There is no bony tenderness or step-off.  Skin:    General: Skin is warm and dry.  Neurological:     General: No focal deficit present.     Mental Status: He is alert.     Comments: Strength is 5 out of 5 in both lower extremities.  He is able to ambulate on his heels and toes without difficulty.  DTRs are trace and symmetrical in the patellar and Achilles tendons  bilaterally.      ED Treatments / Results  Labs (all labs ordered are listed, but only abnormal results are displayed) Labs Reviewed - No data to display  EKG None  Radiology No results found.  Procedures Procedures (including critical care time)  Medications Ordered in ED Medications  ketorolac (TORADOL) injection 60 mg (has no administration in time range)     Initial Impression / Assessment and Plan / ED Course  I have reviewed the triage vital signs and the nursing notes.  Pertinent labs & imaging results that were available during my care of the patient were reviewed by me and considered in my medical decision making (see chart for details).  Patient with radicular low back pain that started after lifting weights.  He is neurologically intact and there are no red flags that would suggest an emergent situation.  Patient will be treated with NSAIDs, pain medication, and is to follow-up as needed if not improving.  Final Clinical Impressions(s) / ED Diagnoses   Final diagnoses:  None    ED Discharge Orders    None       Geoffery Lyonselo, Gedalia Mcmillon, MD 12/30/18 0210

## 2018-12-30 NOTE — Discharge Instructions (Addendum)
Ibuprofen 600 mg 3 times daily for the next 5 days.  Hydrocodone is prescribed as needed for pain not relieved with ibuprofen.  Follow-up with your primary doctor if symptoms or not improving in the next week.

## 2018-12-30 NOTE — ED Triage Notes (Signed)
Patient states back pain. Pt has a hx of back surgery x 3 years ago. Patient states he has been lifting weights x 1 week and feels like he may have pulled a muscle in his back. Patient took a muscle relaxer at home with no relief.

## 2019-05-30 ENCOUNTER — Other Ambulatory Visit: Payer: Self-pay

## 2019-05-30 ENCOUNTER — Emergency Department (HOSPITAL_COMMUNITY): Payer: 59

## 2019-05-30 ENCOUNTER — Emergency Department (HOSPITAL_COMMUNITY)
Admission: EM | Admit: 2019-05-30 | Discharge: 2019-05-30 | Disposition: A | Discharge status: Home / Self Care | Payer: 59 | Attending: Emergency Medicine | Admitting: Emergency Medicine

## 2019-05-30 ENCOUNTER — Encounter (HOSPITAL_COMMUNITY): Payer: Self-pay | Admitting: Emergency Medicine

## 2019-05-30 DIAGNOSIS — M545 Low back pain, unspecified: Secondary | ICD-10-CM

## 2019-05-30 DIAGNOSIS — F17210 Nicotine dependence, cigarettes, uncomplicated: Secondary | ICD-10-CM | POA: Diagnosis not present

## 2019-05-30 DIAGNOSIS — R112 Nausea with vomiting, unspecified: Secondary | ICD-10-CM | POA: Insufficient documentation

## 2019-05-30 DIAGNOSIS — R1084 Generalized abdominal pain: Secondary | ICD-10-CM | POA: Diagnosis not present

## 2019-05-30 DIAGNOSIS — R109 Unspecified abdominal pain: Secondary | ICD-10-CM | POA: Diagnosis present

## 2019-05-30 LAB — URINALYSIS, ROUTINE W REFLEX MICROSCOPIC
Bilirubin Urine: NEGATIVE
Glucose, UA: NEGATIVE mg/dL
Hgb urine dipstick: NEGATIVE
Ketones, ur: 5 mg/dL — AB
Leukocytes,Ua: NEGATIVE
Nitrite: NEGATIVE
Protein, ur: NEGATIVE mg/dL
Specific Gravity, Urine: 1.025 (ref 1.005–1.030)
pH: 6 (ref 5.0–8.0)

## 2019-05-30 LAB — COMPREHENSIVE METABOLIC PANEL
ALT: 24 U/L (ref 0–44)
AST: 15 U/L (ref 15–41)
Albumin: 4.9 g/dL (ref 3.5–5.0)
Alkaline Phosphatase: 65 U/L (ref 38–126)
Anion gap: 10 (ref 5–15)
BUN: 16 mg/dL (ref 6–20)
CO2: 21 mmol/L — ABNORMAL LOW (ref 22–32)
Calcium: 9.5 mg/dL (ref 8.9–10.3)
Chloride: 108 mmol/L (ref 98–111)
Creatinine, Ser: 0.66 mg/dL (ref 0.61–1.24)
GFR calc Af Amer: >60 mL/min (ref >60)
GFR calc non Af Amer: >60 mL/min (ref >60)
Glucose, Bld: 98 mg/dL (ref 70–99)
Potassium: 3.9 mmol/L (ref 3.5–5.1)
Sodium: 139 mmol/L (ref 135–145)
Total Bilirubin: 1.5 mg/dL — ABNORMAL HIGH (ref 0.3–1.2)
Total Protein: 7.6 g/dL (ref 6.5–8.1)

## 2019-05-30 LAB — CBC WITH DIFFERENTIAL/PLATELET
Abs Immature Granulocytes: 0.03 10*3/uL (ref 0.00–0.07)
Basophils Absolute: 0 10*3/uL (ref 0.0–0.1)
Basophils Relative: 0 %
Eosinophils Absolute: 0.2 10*3/uL (ref 0.0–0.5)
Eosinophils Relative: 2 %
HCT: 48.4 % (ref 39.0–52.0)
Hemoglobin: 16.8 g/dL (ref 13.0–17.0)
Immature Granulocytes: 0 %
Lymphocytes Relative: 18 %
Lymphs Abs: 1.5 10*3/uL (ref 0.7–4.0)
MCH: 32.2 pg (ref 26.0–34.0)
MCHC: 34.7 g/dL (ref 30.0–36.0)
MCV: 92.7 fL (ref 80.0–100.0)
Monocytes Absolute: 0.5 10*3/uL (ref 0.1–1.0)
Monocytes Relative: 6 %
Neutro Abs: 6.1 10*3/uL (ref 1.7–7.7)
Neutrophils Relative %: 74 %
Platelets: 220 10*3/uL (ref 150–400)
RBC: 5.22 MIL/uL (ref 4.22–5.81)
RDW: 11.7 % (ref 11.5–15.5)
WBC: 8.3 10*3/uL (ref 4.0–10.5)
nRBC: 0 % (ref 0.0–0.2)

## 2019-05-30 LAB — LIPASE, BLOOD: Lipase: 24 U/L (ref 11–51)

## 2019-05-30 MED ORDER — KETOROLAC TROMETHAMINE 30 MG/ML IJ SOLN
15.0000 mg | Freq: Once | INTRAMUSCULAR | Status: AC
  Administered 2019-05-30: 15 mg via INTRAVENOUS
  Filled 2019-05-30: qty 1

## 2019-05-30 MED ORDER — METHOCARBAMOL 500 MG PO TABS: 500.0000 mg | ORAL_TABLET | Freq: Two times a day (BID) | ORAL | 0 refills | Status: AC

## 2019-05-30 MED ORDER — SODIUM CHLORIDE 0.9 % IV BOLUS
500.0000 mL | Freq: Once | INTRAVENOUS | Status: AC
  Administered 2019-05-30: 500 mL via INTRAVENOUS

## 2019-05-30 MED ORDER — METHOCARBAMOL 500 MG PO TABS
500.0000 mg | ORAL_TABLET | Freq: Once | ORAL | Status: AC
  Administered 2019-05-30: 500 mg via ORAL
  Filled 2019-05-30: qty 1

## 2019-05-30 MED ORDER — ONDANSETRON HCL 4 MG/2ML IJ SOLN
4.0000 mg | Freq: Once | INTRAMUSCULAR | Status: AC
  Administered 2019-05-30: 4 mg via INTRAVENOUS
  Filled 2019-05-30: qty 2

## 2019-05-30 NOTE — Discharge Instructions (Addendum)
You have been diagnosed today with Bilateral Lower Back Pain.  At this time there does not appear to be the presence of an emergent medical condition, however there is always the potential for conditions to change. Please read and follow the below instructions.  Please return to the Emergency Department immediately for any new or worsening symptoms. Please be sure to follow up with your Primary Care Provider within one week regarding your visit today; please call their office to schedule an appointment even if you are feeling better for a follow-up visit. Your CT scan today showed early degenerative disc disease as well as multiple Schmorl nodes.  Please discuss these findings with your primary care provider at your next visit. You may take the muscle relaxer Robaxin as prescribed to help with your symptoms.  Do not drive or operate machinery while taking Robaxin as will make you drowsy.  Do not drink alcohol or take other sedating medications with Robaxin as this will worsen side effects. You have been given a medication called Toradol today this is an NSAID-containing medication.  Do not take other NSAID medications for next 2 days including ibuprofen, naproxen, Aleve, Advil.  Please drink plenty of water over the next few days.  Get help right away if: You develop new bowel or bladder control problems. You have unusual weakness or numbness in your arms or legs. You develop nausea or vomiting. You develop abdominal pain. You feel faint. You have fever/chills Any new/concerning or worsening symptoms  Please read the additional information packets attached to your discharge summary.  Do not take your medicine if  develop an itchy rash, swelling in your mouth or lips, or difficulty breathing; call 911 and seek immediate emergency medical attention if this occurs.

## 2019-05-30 NOTE — ED Provider Notes (Addendum)
[facility] EMERGENCY DEPARTMENT Provider Note   CSN: [id number] Arrival date & time: 05/30/19  1212    History   Chief Complaint Chief Complaint  Patient presents with   Flank Pain    HPI Gabriel Chapman is a 23 y.o. male with history of back surgery for fatty tumor removal, heart murmur, polysubstance abuse, MDD presents today for flank pain.  Patient reports 4-day history of bilateral flank pain, left worse than right moderate intensity throbbing without aggravating or alleviating factors, 1 dose of muscle relaxer and hydroxyzine last night without relief.  Patient reports one episode of nausea and vomiting last night with pain, nonbloody/nonbilious.  Denies history of similar pain in the past.  Denies fever/chills, diarrhea, testicular pain/swelling, dysuria/hematuria, penile discharge, concern for STD, injury/trauma, saddle area paresthesias, bowel/bladder incontinence, urinary retention, IV drug use, history of cancer or any additional concerns.    HPI  Past Medical History:  Diagnosis Date   Heart murmur    History of back surgery    3 years     Patient Active Problem List   Diagnosis Date Noted   Polysubstance (excluding opioids) dependence, daily use ([place]) 10/29/2018   Severe major depression, single episode, without psychotic features ([place]) 10/27/2018   Cocaine abuse ([place]) 10/25/2018   Nausea and vomiting 10/11/2015   Enteritis 10/11/2015   ETOH abuse 10/11/2015   Tobacco abuse 10/11/2015   Leukocytosis 10/11/2015    Past Surgical History:  Procedure Laterality Date   BACK SURGERY          Home Medications    Prior to Admission medications   Medication Sig Start Date End Date Taking? Authorizing Provider  hydrOXYzine (ATARAX/VISTARIL) 25 MG tablet Take 1 tablet (25 mg total) by mouth 3 (three) times daily as needed for anxiety. 10/29/18  Yes Nwoko, Herbert Pun I, NP  nicotine (NICODERM CQ - DOSED IN MG/24 HOURS) 14 mg/24hr patch Place 1 patch (14  mg total) onto the skin daily. (May purchase from over the counter): For smoking cessation Patient not taking: Reported on 05/30/2019 10/30/18   Lindell Spar I, NP  sertraline (ZOLOFT) 50 MG tablet Take 1 tablet (50 mg total) by mouth daily. For depression Patient not taking: Reported on 05/30/2019 10/30/18   Lindell Spar I, NP  traZODone (DESYREL) 50 MG tablet Take 1 tablet (50 mg total) by mouth at bedtime as needed for sleep. Patient not taking: Reported on 05/30/2019 10/29/18   Lindell Spar I, NP    Family History No family history on file.  Social History Social History   Tobacco Use   Smoking status: Current Every Day Smoker    Packs/day: 0.25    Types: Cigarettes   Smokeless tobacco: Current User    Types: Chew  Substance Use Topics   Alcohol use: Yes    Alcohol/week: 5.0 - 6.0 standard drinks    Types: 5 - 6 Cans of beer per week    Comment: "has drank for past 3 days" but "normally just on weekends"    Drug use: Yes    Types: Marijuana     Allergies   Patient has no known allergies.   Review of Systems Review of Systems  Constitutional: Negative.  Negative for chills and fever.  Gastrointestinal: Positive for abdominal pain, nausea and vomiting. Negative for blood in stool and diarrhea.  Genitourinary: Positive for flank pain. Negative for discharge, dysuria, hematuria, scrotal swelling and testicular pain.  Neurological: Negative for weakness and numbness.  Denies saddle or paresthesias Denies bowel/bladder incontinence Denies urinary retention  All other systems reviewed and are negative.  Physical Exam Updated Vital Signs BP 136/75 (BP Location: Left Arm)    Pulse 85    Temp 97.7 F (36.5 C) (Oral)    Resp 16    Ht 5\' 8"  (1.727 m)    Wt 108.9 kg    SpO2 100%    BMI 36.49 kg/m   Physical Exam Constitutional:      General: He is not in acute distress.    Appearance: Normal appearance. He is obese. He is not ill-appearing or diaphoretic.  HENT:      Head: Normocephalic and atraumatic. No raccoon eyes or Battle's sign.     Jaw: There is normal jaw occlusion. No trismus.     Right Ear: External ear normal.     Left Ear: External ear normal.     Nose: Nose normal.     Mouth/Throat:     Mouth: Mucous membranes are moist.     Pharynx: Oropharynx is clear.  Eyes:     General: Vision grossly intact. Gaze aligned appropriately.     Conjunctiva/sclera: Conjunctivae normal.     Pupils: Pupils are equal, round, and reactive to light.  Neck:     Musculoskeletal: Full passive range of motion without pain, normal range of motion and neck supple. No neck rigidity.     Trachea: Trachea and phonation normal. No tracheal tenderness or tracheal deviation.  Cardiovascular:     Rate and Rhythm: Normal rate and regular rhythm.     Pulses:          Radial pulses are 2+ on the right side and 2+ on the left side.       Dorsalis pedis pulses are 2+ on the right side and 2+ on the left side.  Pulmonary:     Effort: Pulmonary effort is normal. No accessory muscle usage or respiratory distress.     Breath sounds: Normal air entry.  Chest:     Chest wall: No deformity, tenderness or crepitus.  Abdominal:     General: Bowel sounds are normal. There is no distension.     Palpations: Abdomen is soft. There is no pulsatile mass.     Tenderness: There is generalized abdominal tenderness. There is no right CVA tenderness, left CVA tenderness, guarding or rebound.  Genitourinary:    Comments: Exam deferred by patient Musculoskeletal:       Back:     Comments: No midline C/T/L spinal tenderness to palpation no deformity, crepitus, or step-off noted. No sign of injury to the neck or back. - Well-healed surgical scars bilateral lumbar spine or patient reports removal of fatty tumors.  Feet:     Right foot:     Protective Sensation: 3 sites tested. 3 sites sensed.     Left foot:     Protective Sensation: 3 sites tested. 3 sites sensed.  Skin:    General: Skin  is warm and dry.     Capillary Refill: Capillary refill takes less than 2 seconds.  Neurological:     Mental Status: He is alert.     GCS: GCS eye subscore is 4. GCS verbal subscore is 5. GCS motor subscore is 6.     Comments: Speech is clear and goal oriented, follows commands Major Cranial nerves without deficit, no facial droop Normal strength in upper and lower extremities bilaterally including dorsiflexion and plantar flexion, strong and equal grip strength Sensation  normal to light and sharp touch Moves extremities without ataxia, coordination intact Normal gait DTR 2+ bilateral patella, no clonus of the feet  Psychiatric:        Behavior: Behavior is cooperative.    ED Treatments / Results  Labs (all labs ordered are listed, but only abnormal results are displayed) Labs Reviewed  URINALYSIS, ROUTINE W REFLEX MICROSCOPIC - Abnormal; Notable for the following components:      Result Value   Ketones, ur 5 (*)    All other components within normal limits  COMPREHENSIVE METABOLIC PANEL - Abnormal; Notable for the following components:   CO2 21 (*)    Total Bilirubin 1.5 (*)    All other components within normal limits  CBC WITH DIFFERENTIAL/PLATELET  LIPASE, BLOOD    EKG None  Radiology Ct Renal Stone Study  Result Date: 05/30/2019 CLINICAL DATA:  Bilateral flank pain for 4 days. EXAM: CT ABDOMEN AND PELVIS WITHOUT CONTRAST TECHNIQUE: Multidetector CT imaging of the abdomen and pelvis was performed following the standard protocol without IV contrast. COMPARISON:  Body CT 10/11/2015 FINDINGS: Lower chest: No acute abnormality. Hepatobiliary: No focal liver abnormality is seen. No gallstones, gallbladder wall thickening, or biliary dilatation. Pancreas: Unremarkable. No pancreatic ductal dilatation or surrounding inflammatory changes. Spleen: Normal in size without focal abnormality. Adrenals/Urinary Tract: Adrenal glands are unremarkable. Kidneys are normal, without renal  calculi, focal lesion, or hydronephrosis. Bladder is unremarkable. Stomach/Bowel: Stomach is within normal limits. Appendix appears normal. No evidence of bowel wall thickening, distention, or inflammatory changes. Vascular/Lymphatic: No significant vascular findings are present. No enlarged abdominal or pelvic lymph nodes. Reproductive: Prostate is unremarkable. Other: No abdominal wall hernia or abnormality. No abdominopelvic ascites. Musculoskeletal: Early degenerative disc disease of the spine with formation of Schmorl's nodes is noted, most prominent at T8-T9, T9-T10, T10-T11, L1-L2. IMPRESSION: 1. No evidence of acute abnormality within the abdomen or pelvis. 2. 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 Formation of Schmorl's nodes is noted, most prominent at T8-T9, T9-T10, T10-T11, and L1-L2, potentially a cause of pain. Electronically Signed   By: Ted Mcalpineobrinka  Dimitrova M.D.   On: 05/30/2019 13:19   Procedures Procedures (including critical care time)  Medications Ordered in ED Medications  ketorolac (TORADOL) 30 MG/ML injection 15 mg (has no administration in time range)  methocarbamol (ROBAXIN) tablet 500 mg (has no administration in time range)  sodium chloride 0.9 % bolus 500 mL (0 mLs Intravenous Stopped 05/30/19 1323)  ondansetron (ZOFRAN) injection 4 mg (4 mg Intravenous Given 05/30/19 1322)     Initial Impression / Assessment and Plan / ED Course  I have reviewed the triage vital signs and the nursing notes.  Pertinent labs & imaging results that were available during my care of the patient were reviewed by me and considered in my medical decision making (see chart for details).    CBC within normal limits Lipase within normal limits CMP nonacute Urinalysis nonacute CT renal stone study:  IMPRESSION:  1. No evidence of acute abnormality within the abdomen or pelvis.  2. 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 Formation of  Schmorl's nodes is noted, most prominent at  T8-T9, T9-T10, T10-T11,  and L1-L2, potentially a cause of pain.  - Patient updated on results today and states understanding.  Suspect is bilateral flank/lower back pain today is musculoskeletal in nature.  On reassessment patient does report he had tripped and fallen nearly 2 weeks ago but did not have pain for a few days afterwards so he  did not feel this was related.  He denies head injury, loss of consciousness, neck pain, wound or any other injuries.  He denies fever, IV drug use, night sweats, weight loss, cancer, saddle area paresthesias, urinary retention, bowel/bladder incontinence, he has no neurologic deficits on exam, normal patellar reflexes, no clonus of the feet.  No red flags or indication for further imaging at this time.  Will begin patient on Robaxin for musculoskeletal pain, I have discussed precautions regarding muscle relaxers with the patient and he states understanding.  Patient given Toradol here in the emergency department and states understanding to avoid NSAIDs for the next 2 days and drink plenty of water.  On reassessment he reports some improvement in pain, he is drinking a soda without difficulty, nausea or vomiting.  No red flags, doubt spinal epidural abscess, cauda equina, AAA or acute intra-abdominal or CNS process at this time.  No indication for antibiotics.  At this time there does not appear to be any evidence of an acute emergency medical condition and the patient appears stable for discharge with appropriate outpatient follow up. Diagnosis was discussed with patient who verbalizes understanding of care plan and is agreeable to discharge. I have discussed return precautions with patient who verbalizes understanding of return precautions. Patient encouraged to follow-up with their PCP. All questions answered.  Patient has been discharged in good condition.  Patient's case discussed with Dr. Clarene DukeMcManus who agrees with plan to discharge with symptomatic treatment and pcp  follow-up.   Note: Portions of this report may have been transcribed using voice recognition software. Every effort was made to ensure accuracy; however, inadvertent computerized transcription errors may still be present. Final Clinical Impressions(s) / ED Diagnoses   Final diagnoses:  Acute bilateral low back pain without sciatica    ED Discharge Orders    None       Bill SalinasMorelli, Malaiya Paczkowski A, PA-C 05/30/19 1434    [address]Preslei Blakley A, PA-C 05/30/19 1435    Samuel JesterMcManus, Kathleen, DO 06/02/19 [phone]

## 2019-05-30 NOTE — ED Triage Notes (Signed)
Patient complains of left flank pain that started  4 days ago. States pain makes him nauseas. NAD

## 2019-08-07 ENCOUNTER — Encounter (HOSPITAL_COMMUNITY): Payer: Self-pay | Admitting: Emergency Medicine

## 2019-08-07 ENCOUNTER — Other Ambulatory Visit: Payer: Self-pay

## 2019-08-07 ENCOUNTER — Emergency Department (HOSPITAL_COMMUNITY)
Admission: EM | Admit: 2019-08-07 | Discharge: 2019-08-07 | Disposition: A | Discharge status: Home / Self Care | Payer: 59 | Attending: Emergency Medicine | Admitting: Emergency Medicine

## 2019-08-07 DIAGNOSIS — R109 Unspecified abdominal pain: Secondary | ICD-10-CM | POA: Diagnosis present

## 2019-08-07 DIAGNOSIS — F17210 Nicotine dependence, cigarettes, uncomplicated: Secondary | ICD-10-CM | POA: Diagnosis not present

## 2019-08-07 DIAGNOSIS — R1011 Right upper quadrant pain: Secondary | ICD-10-CM

## 2019-08-07 DIAGNOSIS — R1013 Epigastric pain: Secondary | ICD-10-CM

## 2019-08-07 DIAGNOSIS — F1210 Cannabis abuse, uncomplicated: Secondary | ICD-10-CM | POA: Diagnosis not present

## 2019-08-07 LAB — CBC WITH DIFFERENTIAL/PLATELET
Abs Immature Granulocytes: 0.05 10*3/uL (ref 0.00–0.07)
Basophils Absolute: 0 10*3/uL (ref 0.0–0.1)
Basophils Relative: 0 %
Eosinophils Absolute: 0.1 10*3/uL (ref 0.0–0.5)
Eosinophils Relative: 0 %
HCT: 46.8 % (ref 39.0–52.0)
Hemoglobin: 16.1 g/dL (ref 13.0–17.0)
Immature Granulocytes: 0 %
Lymphocytes Relative: 17 %
Lymphs Abs: 2 10*3/uL (ref 0.7–4.0)
MCH: 31.8 pg (ref 26.0–34.0)
MCHC: 34.4 g/dL (ref 30.0–36.0)
MCV: 92.3 fL (ref 80.0–100.0)
Monocytes Absolute: 0.8 10*3/uL (ref 0.1–1.0)
Monocytes Relative: 7 %
Neutro Abs: 9.1 10*3/uL — ABNORMAL HIGH (ref 1.7–7.7)
Neutrophils Relative %: 76 %
Platelets: 244 10*3/uL (ref 150–400)
RBC: 5.07 MIL/uL (ref 4.22–5.81)
RDW: 11.8 % (ref 11.5–15.5)
WBC: 12 10*3/uL — ABNORMAL HIGH (ref 4.0–10.5)
nRBC: 0 % (ref 0.0–0.2)

## 2019-08-07 LAB — COMPREHENSIVE METABOLIC PANEL
ALT: 21 U/L (ref 0–44)
AST: 17 U/L (ref 15–41)
Albumin: 5.3 g/dL — ABNORMAL HIGH (ref 3.5–5.0)
Alkaline Phosphatase: 65 U/L (ref 38–126)
Anion gap: 12 (ref 5–15)
BUN: 12 mg/dL (ref 6–20)
CO2: 21 mmol/L — ABNORMAL LOW (ref 22–32)
Calcium: 9.6 mg/dL (ref 8.9–10.3)
Chloride: 106 mmol/L (ref 98–111)
Creatinine, Ser: 0.66 mg/dL (ref 0.61–1.24)
GFR calc Af Amer: >60 mL/min (ref >60)
GFR calc non Af Amer: >60 mL/min (ref >60)
Glucose, Bld: 87 mg/dL (ref 70–99)
Potassium: 3.6 mmol/L (ref 3.5–5.1)
Sodium: 139 mmol/L (ref 135–145)
Total Bilirubin: 1.3 mg/dL — ABNORMAL HIGH (ref 0.3–1.2)
Total Protein: 8.2 g/dL — ABNORMAL HIGH (ref 6.5–8.1)

## 2019-08-07 LAB — LIPASE, BLOOD: Lipase: 23 U/L (ref 11–51)

## 2019-08-07 LAB — ETHANOL: Alcohol, Ethyl (B): <10 mg/dL (ref <10)

## 2019-08-07 LAB — URINALYSIS, ROUTINE W REFLEX MICROSCOPIC
Bilirubin Urine: NEGATIVE
Glucose, UA: NEGATIVE mg/dL
Hgb urine dipstick: NEGATIVE
Ketones, ur: 20 mg/dL — AB
Leukocytes,Ua: NEGATIVE
Nitrite: NEGATIVE
Protein, ur: NEGATIVE mg/dL
Specific Gravity, Urine: 1.02 (ref 1.005–1.030)
pH: 5 (ref 5.0–8.0)

## 2019-08-07 MED ORDER — PANTOPRAZOLE SODIUM 40 MG PO TBEC: 40.0000 mg | DELAYED_RELEASE_TABLET | Freq: Every day | ORAL | 0 refills | Status: DC

## 2019-08-07 MED ORDER — ALUM & MAG HYDROXIDE-SIMETH 200-200-20 MG/5ML PO SUSP
30.0000 mL | Freq: Once | ORAL | Status: AC
  Administered 2019-08-07: 30 mL via ORAL
  Filled 2019-08-07: qty 30

## 2019-08-07 MED ORDER — PROMETHAZINE HCL 12.5 MG PO TABS: 12.5000 mg | ORAL_TABLET | Freq: Four times a day (QID) | ORAL | 0 refills | Status: DC | PRN

## 2019-08-07 MED ORDER — FAMOTIDINE 20 MG PO TABS: 20.0000 mg | ORAL_TABLET | Freq: Two times a day (BID) | ORAL | 0 refills | Status: DC

## 2019-08-07 MED ORDER — FAMOTIDINE IN NACL 20-0.9 MG/50ML-% IV SOLN
20.0000 mg | Freq: Once | INTRAVENOUS | Status: AC
  Administered 2019-08-07: 20 mg via INTRAVENOUS
  Filled 2019-08-07: qty 50

## 2019-08-07 NOTE — ED Notes (Signed)
Pt told to call the [place] number on his D/C papers. Pt verbalized understanding of instructions.

## 2019-08-07 NOTE — ED Provider Notes (Signed)
Received patient at change of shift.  Patient is a 22 year old male who presents to the emergency department with upper abdomen pain.  There is waxing and waning of his symptoms.  The problems worsen with certain foods.  Work-up is ongoing.  Ultrasound pending, as well as urine analysis and alcohol level pending.  Urine analysis was nonacute. Patient would not be able to have ultrasound today.  Outpatient ultrasound requested for the patient. Prescription for promethazine every 6 hours as needed, Pepcid 2 times daily, and Protonix 2 times daily requested for the patient.  I have reviewed the findings up to this point with the patient.  I reviewed with the patient the need for the ultrasound.  The patient acknowledges understanding of the instructions.         Out pt ultrasound ordered.   Lily Kocher, PA-C 08/08/19 1052    Fredia Sorrow, MD 08/15/19 [phone]

## 2019-08-07 NOTE — ED Provider Notes (Signed)
[facility] EMERGENCY DEPARTMENT Provider Note   CSN: [id number] Arrival date & time: 08/07/19  1430     History   Chief Complaint Chief Complaint  Patient presents with  . Abdominal Pain    HPI Gabriel Chapman is a 23 y.o. male with history of polysubstance and EtOH abuse presents today for upper abdominal pain x1 week.  Patient reports gradual onset of upper abdominal pain a waxing and waning moderate-severe in intensity aching throbbing sensation constant worsened with eating food and without alleviating factors.  He endorses nausea with occasional nonbloody/nonbilious emesis over the past week.  He is attempted omeprazole without relief of his symptoms.  He reports a small amount of diarrhea yesterday without blood or melena.  Of note patient reports drinking 1 beer 2 nights ago, he reports he did not finish this due to nausea, he reports that he has greatly decreased his alcohol intake over the past several months.  Patient denies fever/chills, headache/vision changes, neck pain, chest pain/shortness of breath, cough, testicular pain/swelling, numbness/weakness, tingling of the extremities, fall/injury or additional concerns.     HPI  Past Medical History:  Diagnosis Date  . Heart murmur   . History of back surgery    3 years     Patient Active Problem List   Diagnosis Date Noted  . Polysubstance (excluding opioids) dependence, daily use ([facility]) 10/29/2018  . Severe major depression, single episode, without psychotic features ([facility]) 10/27/2018  . Cocaine abuse ([facility]) 10/25/2018  . Nausea and vomiting 10/11/2015  . Enteritis 10/11/2015  . ETOH abuse 10/11/2015  . Tobacco abuse 10/11/2015  . Leukocytosis 10/11/2015    Past Surgical History:  Procedure Laterality Date  . BACK SURGERY          Home Medications    Prior to Admission medications   Medication Sig Start Date End Date Taking? Authorizing Provider  hydrOXYzine (ATARAX/VISTARIL) 25 MG tablet Take 1  tablet (25 mg total) by mouth 3 (three) times daily as needed for anxiety. 10/29/18   Armandina Stammer I, NP  nicotine (NICODERM CQ - DOSED IN MG/24 HOURS) 14 mg/24hr patch Place 1 patch (14 mg total) onto the skin daily. (May purchase from over the counter): For smoking cessation Patient not taking: Reported on 05/30/2019 10/30/18   Armandina Stammer I, NP  sertraline (ZOLOFT) 50 MG tablet Take 1 tablet (50 mg total) by mouth daily. For depression Patient not taking: Reported on 05/30/2019 10/30/18   Armandina Stammer I, NP  traZODone (DESYREL) 50 MG tablet Take 1 tablet (50 mg total) by mouth at bedtime as needed for sleep. Patient not taking: Reported on 05/30/2019 10/29/18   Sanjuana Kava, NP    Family History History reviewed. No pertinent family history.  Social History Social History   Tobacco Use  . Smoking status: Current Every Day Smoker    Packs/day: 0.25    Types: Cigarettes  . Smokeless tobacco: Current User    Types: Chew  Substance Use Topics  . Alcohol use: Yes    Alcohol/week: 5.0 - 6.0 standard drinks    Types: 5 - 6 Cans of beer per week    Comment: "has drank for past 3 days" but "normally just on weekends"   . Drug use: Yes    Types: Marijuana    Comment: last use 1 week ago     Allergies   Patient has no known allergies.   Review of Systems Review of Systems Ten systems are reviewed and are negative  for acute change except as noted in the HPI   Physical Exam Updated Vital Signs BP 129/74 (BP Location: Left Arm)   Pulse 80   Temp 98.1 F (36.7 C) (Oral)   Resp 16   Ht 5\' 8"  (1.727 m)   Wt 113.4 kg   SpO2 99%   BMI 38.01 kg/m   Physical Exam Constitutional:      General: He is not in acute distress.    Appearance: Normal appearance. He is well-developed. He is not ill-appearing or diaphoretic.  HENT:     Head: Normocephalic and atraumatic.     Right Ear: External ear normal.     Left Ear: External ear normal.     Nose: Nose normal.  Eyes:     General:  Vision grossly intact. Gaze aligned appropriately.     Pupils: Pupils are equal, round, and reactive to light.  Neck:     Musculoskeletal: Normal range of motion.     Trachea: Trachea and phonation normal. No tracheal deviation.  Cardiovascular:     Rate and Rhythm: Normal rate and regular rhythm.  Pulmonary:     Effort: Pulmonary effort is normal. No respiratory distress.  Abdominal:     General: There is no distension.     Palpations: Abdomen is soft.     Tenderness: There is abdominal tenderness in the right upper quadrant and epigastric area. There is no guarding or rebound. Negative signs include Murphy's sign.  Genitourinary:    Comments: Examination deferred by patient. Musculoskeletal: Normal range of motion.  Skin:    General: Skin is warm and dry.  Neurological:     Mental Status: He is alert.     GCS: GCS eye subscore is 4. GCS verbal subscore is 5. GCS motor subscore is 6.     Comments: Speech is clear and goal oriented, follows commands Major Cranial nerves without deficit, no facial droop Moves extremities without ataxia, coordination intact  Psychiatric:        Behavior: Behavior normal.     ED Treatments / Results  Labs (all labs ordered are listed, but only abnormal results are displayed) Labs Reviewed  CBC WITH DIFFERENTIAL/PLATELET - Abnormal; Notable for the following components:      Result Value   WBC 12.0 (*)    Neutro Abs 9.1 (*)    All other components within normal limits  COMPREHENSIVE METABOLIC PANEL - Abnormal; Notable for the following components:   CO2 21 (*)    Total Protein 8.2 (*)    Albumin 5.3 (*)    Total Bilirubin 1.3 (*)    All other components within normal limits  LIPASE, BLOOD  URINALYSIS, ROUTINE W REFLEX MICROSCOPIC  ETHANOL    EKG None  Radiology No results found.  Procedures Procedures (including critical care time)  Medications Ordered in ED Medications  alum & mag hydroxide-simeth (MAALOX/MYLANTA) 200-200-20  MG/5ML suspension 30 mL (has no administration in time range)  famotidine (PEPCID) IVPB 20 mg premix (has no administration in time range)     Initial Impression / Assessment and Plan / ED Course  I have reviewed the triage vital signs and the nursing notes.  Pertinent labs & imaging results that were available during my care of the patient were reviewed by me and considered in my medical decision making (see chart for details).    On initial evaluation patient overall well-appearing no acute distress with 1 week of epigastric abdominal pain worsened with eating with some associated nausea  vomiting and diarrhea.  He is tender to the epigastric and right upper quadrant areas with a negative Murphy sign.  He deferred GU examination he denies urinary symptoms or testicular pain or swelling.  He does have history of alcohol and polysubstance abuse but reports decreased alcohol intake over the past several months.  He does not appear to be in withdrawal today.  Higher suspicion for pancreatitis versus gastritis versus cholecystitis at this time, lower suspicion for an early appendicitis, he has no lower abdominal tenderness.  Will give GI cocktail and Pepcid, obtain blood work and right upper quadrant ultrasound. - CBC leukocytosis of 12.0 with left shift Lipase within normal limits CMP nonacute ================= Care handoff given to Ivery QualeHobson Bryant PA-C at shift change, urinalysis, right upper quadrant ultrasound and ethanol pending at this time in addition to the medications.  Plan of care at this time is to follow-up on pending studies, and reassess.  Disposition per oncoming team.   Note: Portions of this report may have been transcribed using voice recognition software. Every effort was made to ensure accuracy; however, inadvertent computerized transcription errors may still be present. Final Clinical Impressions(s) / ED Diagnoses   Final diagnoses:  Epigastric pain    ED Discharge Orders     None       Elizabeth PalauMorelli, Blayke Cordrey A, PA-C 08/07/19 1643    Vanetta MuldersZackowski, Scott, MD 08/15/19 [phone]

## 2019-08-07 NOTE — ED Notes (Signed)
Signature pad in room not working.  

## 2019-08-07 NOTE — ED Notes (Signed)
Pt ambulatory to waiting room. Pt verbalized understanding of discharge instructions.   

## 2019-08-07 NOTE — ED Triage Notes (Addendum)
PT c/o middle upper abdominal discomfort x1 week with nausea after eating. PT states no appetite in the past 2 days and states tenderness upon palpation and no relief after two doses of omeprazole. PT states last BM x1 day ago and was diarrhea.

## 2019-08-07 NOTE — Discharge Instructions (Addendum)
Your examination suggests pain in the epigastric and right upper quadrant of your abdomen.  Your lab work is reassuring at this time, your vital signs are also reassuring at this time.  The opportunity for ultrasound was given, and you have decided not to have an ultrasound of your abdomen at this time.  Please call the number on your discharge instructions to seek care at the time for your ultrasound.  Please use Pepcid and Protonix 2 times daily, use Phenergan every 6 hours if needed for nausea. This medication may cause drowsiness. Please do not drink, drive, or participate in activity that requires concentration while taking this medication.

## 2019-08-08 ENCOUNTER — Encounter (HOSPITAL_COMMUNITY): Payer: Self-pay

## 2019-08-08 ENCOUNTER — Emergency Department (HOSPITAL_COMMUNITY): Payer: 59

## 2019-08-08 ENCOUNTER — Other Ambulatory Visit: Payer: Self-pay

## 2019-08-08 ENCOUNTER — Emergency Department (HOSPITAL_COMMUNITY)
Admission: EM | Admit: 2019-08-08 | Discharge: 2019-08-08 | Disposition: A | Discharge status: Home / Self Care | Payer: 59 | Attending: Emergency Medicine | Admitting: Emergency Medicine

## 2019-08-08 DIAGNOSIS — R109 Unspecified abdominal pain: Secondary | ICD-10-CM

## 2019-08-08 DIAGNOSIS — R197 Diarrhea, unspecified: Secondary | ICD-10-CM | POA: Diagnosis not present

## 2019-08-08 DIAGNOSIS — R1013 Epigastric pain: Secondary | ICD-10-CM | POA: Diagnosis present

## 2019-08-08 DIAGNOSIS — R112 Nausea with vomiting, unspecified: Secondary | ICD-10-CM | POA: Insufficient documentation

## 2019-08-08 DIAGNOSIS — F17210 Nicotine dependence, cigarettes, uncomplicated: Secondary | ICD-10-CM | POA: Insufficient documentation

## 2019-08-08 LAB — URINALYSIS, ROUTINE W REFLEX MICROSCOPIC
Bilirubin Urine: NEGATIVE
Glucose, UA: NEGATIVE mg/dL
Hgb urine dipstick: NEGATIVE
Ketones, ur: 80 mg/dL — AB
Leukocytes,Ua: NEGATIVE
Nitrite: NEGATIVE
Protein, ur: 30 mg/dL — AB
Specific Gravity, Urine: 1.03 (ref 1.005–1.030)
pH: 5 (ref 5.0–8.0)

## 2019-08-08 LAB — CBC
HCT: 49.2 % (ref 39.0–52.0)
Hemoglobin: 16.8 g/dL (ref 13.0–17.0)
MCH: 31.3 pg (ref 26.0–34.0)
MCHC: 34.1 g/dL (ref 30.0–36.0)
MCV: 91.8 fL (ref 80.0–100.0)
Platelets: 266 K/uL (ref 150–400)
RBC: 5.36 MIL/uL (ref 4.22–5.81)
RDW: 11.7 % (ref 11.5–15.5)
WBC: 11.7 K/uL — ABNORMAL HIGH (ref 4.0–10.5)
nRBC: 0 % (ref 0.0–0.2)

## 2019-08-08 LAB — COMPREHENSIVE METABOLIC PANEL WITH GFR
ALT: 22 U/L (ref 0–44)
AST: 17 U/L (ref 15–41)
Albumin: 5.5 g/dL — ABNORMAL HIGH (ref 3.5–5.0)
Alkaline Phosphatase: 65 U/L (ref 38–126)
Anion gap: 12 (ref 5–15)
BUN: 14 mg/dL (ref 6–20)
CO2: 20 mmol/L — ABNORMAL LOW (ref 22–32)
Calcium: 9.8 mg/dL (ref 8.9–10.3)
Chloride: 106 mmol/L (ref 98–111)
Creatinine, Ser: 0.67 mg/dL (ref 0.61–1.24)
GFR calc Af Amer: >60 mL/min
GFR calc non Af Amer: >60 mL/min
Glucose, Bld: 92 mg/dL (ref 70–99)
Potassium: 3.4 mmol/L — ABNORMAL LOW (ref 3.5–5.1)
Sodium: 138 mmol/L (ref 135–145)
Total Bilirubin: 1.4 mg/dL — ABNORMAL HIGH (ref 0.3–1.2)
Total Protein: 8.1 g/dL (ref 6.5–8.1)

## 2019-08-08 LAB — LIPASE, BLOOD: Lipase: 28 U/L (ref 11–51)

## 2019-08-08 MED ORDER — SODIUM CHLORIDE 0.9 % IV BOLUS: 500.0000 mL | Freq: Once | INTRAVENOUS | Status: DC

## 2019-08-08 MED ORDER — SODIUM CHLORIDE 0.9 % IV BOLUS
1000.0000 mL | Freq: Once | INTRAVENOUS | Status: AC
  Administered 2019-08-08: 1000 mL via INTRAVENOUS

## 2019-08-08 MED ORDER — SUCRALFATE 1 GM/10ML PO SUSP: 1.0000 g | Freq: Three times a day (TID) | ORAL | 0 refills | Status: DC

## 2019-08-08 MED ORDER — LIDOCAINE VISCOUS HCL 2 % MT SOLN
15.0000 mL | Freq: Once | OROMUCOSAL | Status: AC
  Administered 2019-08-08: 15 mL via ORAL
  Filled 2019-08-08: qty 15

## 2019-08-08 MED ORDER — PANTOPRAZOLE SODIUM 40 MG IV SOLR
40.0000 mg | Freq: Once | INTRAVENOUS | Status: AC
  Administered 2019-08-08: 40 mg via INTRAVENOUS
  Filled 2019-08-08: qty 40

## 2019-08-08 MED ORDER — ALUM & MAG HYDROXIDE-SIMETH 200-200-20 MG/5ML PO SUSP
30.0000 mL | Freq: Once | ORAL | Status: AC
  Administered 2019-08-08: 30 mL via ORAL
  Filled 2019-08-08: qty 30

## 2019-08-08 MED ORDER — ONDANSETRON HCL 4 MG/2ML IJ SOLN
4.0000 mg | Freq: Once | INTRAMUSCULAR | Status: AC
  Administered 2019-08-08: 4 mg via INTRAVENOUS
  Filled 2019-08-08: qty 2

## 2019-08-08 MED ORDER — SODIUM CHLORIDE 0.9 % IV BOLUS
1000.0000 mL | Freq: Once | INTRAVENOUS | Status: AC
  Administered 2019-08-08: 13:00:00 1000 mL via INTRAVENOUS

## 2019-08-08 MED ORDER — SODIUM CHLORIDE 0.9% FLUSH: 3.0000 mL | Freq: Once | INTRAVENOUS | Status: DC

## 2019-08-08 NOTE — ED Provider Notes (Signed)
[facility] EMERGENCY DEPARTMENT Provider Note   CSN: [id number] Arrival date & time: 08/08/19  1105     History   Chief Complaint Chief Complaint  Patient presents with  . Abdominal Pain    HPI Gabriel Chapman is a 23 y.o. male with a hx of polysubstance abuse (tobacco, EtOH, cocaine) who returns to the ED w/ complaints of continued abdominal pain.  Patient states he has been having intermittent abdominal pain for 1.5 weeks.  He describes the pain as being to the upper abdomen, primarily in the epigastric region, pain seems to be worse/triggered by eating or drinking anything, no alleviating factors.  He has had associated nausea with about 5 episodes of emesis over the course of the past week and a half.  Reports daily loose stools as well.  Denies fever, chills, hematemesis, melena, hematochezia, dysuria, testicular pain/swelling, recent foreign travel, or recent antibiotics.  He was seen in the emergency department yesterday and was supposed to be scheduled for an outpatient ultrasound but had difficulty scheduling this, he decided to come back to try to have this done today.  Patient reports he used to drink fairly heavily, no longer drinks daily, last drink was 3 days ago he was unable to tolerate this secondary to his abdominal discomfort.  He states that he does take a Goody powder approximately every other day for general aches and pains and headaches.  No other NSAID use.  He is not on blood thinners.  Reports occasional marijuana use, denies any other drug use.    HPI  Past Medical History:  Diagnosis Date  . Heart murmur   . History of back surgery    3 years     Patient Active Problem List   Diagnosis Date Noted  . Polysubstance (excluding opioids) dependence, daily use ([place]) 10/29/2018  . Severe major depression, single episode, without psychotic features ([place]) 10/27/2018  . Cocaine abuse ([place]) 10/25/2018  . Nausea and vomiting 10/11/2015  . Enteritis 10/11/2015  .  ETOH abuse 10/11/2015  . Tobacco abuse 10/11/2015  . Leukocytosis 10/11/2015    Past Surgical History:  Procedure Laterality Date  . BACK SURGERY          Home Medications    Prior to Admission medications   Medication Sig Start Date End Date Taking? Authorizing Provider  famotidine (PEPCID) 20 MG tablet Take 1 tablet (20 mg total) by mouth 2 (two) times daily. 08/07/19   Lily Kocher, PA-C  hydrOXYzine (ATARAX/VISTARIL) 25 MG tablet Take 1 tablet (25 mg total) by mouth 3 (three) times daily as needed for anxiety. 10/29/18   Lindell Spar I, NP  nicotine (NICODERM CQ - DOSED IN MG/24 HOURS) 14 mg/24hr patch Place 1 patch (14 mg total) onto the skin daily. (May purchase from over the counter): For smoking cessation Patient not taking: Reported on 05/30/2019 10/30/18   Lindell Spar I, NP  pantoprazole (PROTONIX) 40 MG tablet Take 1 tablet (40 mg total) by mouth daily. 08/07/19   Lily Kocher, PA-C  promethazine (PHENERGAN) 12.5 MG tablet Take 1 tablet (12.5 mg total) by mouth every 6 (six) hours as needed. 08/07/19   Lily Kocher, PA-C  sertraline (ZOLOFT) 50 MG tablet Take 1 tablet (50 mg total) by mouth daily. For depression Patient not taking: Reported on 05/30/2019 10/30/18   Lindell Spar I, NP  traZODone (DESYREL) 50 MG tablet Take 1 tablet (50 mg total) by mouth at bedtime as needed for sleep. Patient not taking: Reported on 05/30/2019  10/29/18   Sanjuana KavaNwoko, Agnes I, NP    Family History No family history on file.  Social History Social History   Tobacco Use  . Smoking status: Current Every Day Smoker    Packs/day: 0.25    Types: Cigarettes  . Smokeless tobacco: Current User    Types: Chew  Substance Use Topics  . Alcohol use: Yes    Alcohol/week: 5.0 - 6.0 standard drinks    Types: 5 - 6 Cans of beer per week    Comment: "has drank for past 3 days" but "normally just on weekends"   . Drug use: Yes    Types: Marijuana    Comment: last use 1 week ago     Allergies   Patient  has no known allergies.   Review of Systems Review of Systems  Constitutional: Negative for chills and fever.  Respiratory: Negative for shortness of breath.   Cardiovascular: Negative for chest pain.  Gastrointestinal: Positive for abdominal pain, diarrhea, nausea and vomiting. Negative for anal bleeding, blood in stool and constipation.  Genitourinary: Negative for dysuria, penile swelling and scrotal swelling.  All other systems reviewed and are negative.    Physical Exam Updated Vital Signs BP (!) 156/94 (BP Location: Right Arm)   Pulse 66   Temp 97.7 F (36.5 C) (Oral)   Resp 18   Ht 5\' 8"  (1.727 m)   Wt 113.4 kg   SpO2 99%   BMI 38.01 kg/m   Physical Exam Vitals signs and nursing note reviewed.  Constitutional:      General: He is not in acute distress.    Appearance: He is well-developed. He is not toxic-appearing.  HENT:     Head: Normocephalic and atraumatic.  Eyes:     General:        Right eye: No discharge.        Left eye: No discharge.     Conjunctiva/sclera: Conjunctivae normal.  Neck:     Musculoskeletal: Neck supple.  Cardiovascular:     Rate and Rhythm: Normal rate and regular rhythm.  Pulmonary:     Effort: Pulmonary effort is normal. No respiratory distress.     Breath sounds: Normal breath sounds. No wheezing, rhonchi or rales.  Abdominal:     General: There is no distension.     Palpations: Abdomen is soft.     Tenderness: There is abdominal tenderness in the epigastric area. There is no guarding or rebound. Negative signs include Murphy's sign.  Skin:    General: Skin is warm and dry.     Findings: No rash.  Neurological:     Mental Status: He is alert.     Comments: Clear speech.   Psychiatric:        Behavior: Behavior normal.    ED Treatments / Results  Labs (all labs ordered are listed, but only abnormal results are displayed) Labs Reviewed  COMPREHENSIVE METABOLIC PANEL - Abnormal; Notable for the following components:       Result Value   Potassium 3.4 (*)    CO2 20 (*)    Albumin 5.5 (*)    Total Bilirubin 1.4 (*)    All other components within normal limits  CBC - Abnormal; Notable for the following components:   WBC 11.7 (*)    All other components within normal limits  URINALYSIS, ROUTINE W REFLEX MICROSCOPIC - Abnormal; Notable for the following components:   Color, Urine AMBER (*)    APPearance HAZY (*)  Ketones, ur 80 (*)    Protein, ur 30 (*)    Bacteria, UA RARE (*)    All other components within normal limits  LIPASE, BLOOD    EKG None  Radiology US Abdomen Limited Ruq  Result Date: 08/08/2019 CLINICAL DATA:  Abdominal pain EXAM: ULTRASOUND ABDOMEN LIMITED RIGHT UPPER QUADRANT COMPARISON:  CT abdomen pelvis dated 05/30/2019. FINDINGS: Gallbladder: No gallstones or wall thickening visualized. No sonographic Murphy sign noted by sonographer. Common bile duct: Diameter: 4 mm Liver: No focal lesion identified. Within normal limits in parenchymal echogenicity. Portal vein is patent on color Doppler imaging with normal direction of blood flow towards the liver. Other: None. IMPRESSION: Normal right upper quadrant ultrasound exam. Electronically Signed   By: Romona Curls M.D.   On: 08/08/2019 13:14    Procedures Procedures (including critical care time)  Medications Ordered in ED Medications  sodium chloride flush (NS) 0.9 % injection 3 mL (has no administration in time range)  pantoprazole (PROTONIX) injection 40 mg (40 mg Intravenous Given 08/08/19 1254)  ondansetron (ZOFRAN) injection 4 mg (4 mg Intravenous Given 08/08/19 1254)  sodium chloride 0.9 % bolus 1,000 mL (1,000 mLs Intravenous New Bag/Given 08/08/19 1317)  sodium chloride 0.9 % bolus 1,000 mL (1,000 mLs Intravenous New Bag/Given 08/08/19 1357)  alum & mag hydroxide-simeth (MAALOX/MYLANTA) 200-200-20 MG/5ML suspension 30 mL (30 mLs Oral Given 08/08/19 1347)    And  lidocaine (XYLOCAINE) 2 % viscous mouth solution 15 mL (15 mLs Oral  Given 08/08/19 1347)     Initial Impression / Assessment and Plan / ED Course  I have reviewed the triage vital signs and the nursing notes.  Pertinent labs & imaging results that were available during my care of the patient were reviewed by me and considered in my medical decision making (see chart for details).    Patient presents to the ED with complaints of abdominal pain. Patient nontoxic appearing, in no apparent distress, vitals WNL other than elevated BP- doubt HTN emergency. On exam patient tender to epigastrium, no peritoneal signs. Will evaluate with labs and RUQ [place] today as this was unable to be scheduled by the patient outpatient. Anti-acids, anti-emetics, and fluids administered.   ER work-up reviewed:  CBC: Mild leukocytosis @ 11.7. No anemia.  CMP: Mild hypokalemia @ 3.4. T bili minimally up @ 1.4. renal function & LFTs WNL.  Lipase: WNL UA: Ketonuria present previously- given 2L of fluids in the ED.  RUQ [place]: unremarkable.   On repeat abdominal exam patient remains without peritoneal signs, doubt cholecystitis, pancreatitis, diverticulitis, appendicitis, bowel obstruction/perforation. Suspect GERD/PUD, doubt significant GI bleed w/o reports of melena/hematochezia/hematemesis- stable hgb/hct & BUN. Patient tolerating PO in the emergency department. Will discharge home with supportive measures, continue previously prescribed pepcid, pantoprazole, & promethazine, will also start carafate. I discussed results, treatment plan, need for PCP follow-up, and return precautions with the patient. Provided opportunity for questions, patient confirmed understanding and is in agreement with plan.   Final Clinical Impressions(s) / ED Diagnoses   Final diagnoses:  Abdominal pain    ED Discharge Orders         Ordered    sucralfate (CARAFATE) 1 GM/10ML suspension  3 times daily with meals & bedtime     08/08/19 1442           Petrucelli, Pleas Koch, PA-C 08/08/19 1445    Bethann Berkshire, MD 08/11/19 1219

## 2019-08-08 NOTE — ED Triage Notes (Signed)
Pt presents to ED with abdominal pain. Pt seen last night and was suppose to have [place] set up. Pt tried getting [place] set up through family doctor but unable to. Pt still having mid upper abdominal discomfort and unable to eat, no appetite.

## 2019-08-08 NOTE — Discharge Instructions (Addendum)
You were seen in the emergency department today for abdominal pain.  Your ultrasound was normal.  Your labs did not show significant abnormalities.  Your potassium was a bit low, please be sure to include potassium rich foods in your diet, handout was provided in your discharge instructions.  Your urine also had some ketones, please be sure to stay well-hydrated as this is likely due to dehydration.  Please have these findings rechecked by primary care within 1 to 2 weeks.  Please take medicines prescribed at your previous ER visit.  Please also start taking Carafate before meals and prior to bedtime.  We have prescribed you new medication(s) today. Discuss the medications prescribed today with your pharmacist as they can have adverse effects and interactions with your other medicines including over the counter and prescribed medications. Seek medical evaluation if you start to experience new or abnormal symptoms after taking one of these medicines, seek care immediately if you start to experience difficulty breathing, feeling of your throat closing, facial swelling, or rash as these could be indications of a more serious allergic reaction  Please follow attached diet guidelines for potassium as well as for recommendations for foods that can can upset your stomach more. Please follow-up with primary care within 3 to 5 days.  Return to the ER for new or worsening symptoms or any other concerns.

## 2019-08-25 ENCOUNTER — Other Ambulatory Visit: Payer: Self-pay

## 2019-08-25 ENCOUNTER — Emergency Department (HOSPITAL_COMMUNITY)
Admission: EM | Admit: 2019-08-25 | Discharge: 2019-08-25 | Disposition: A | Discharge status: Home / Self Care | Payer: 59 | Attending: Emergency Medicine | Admitting: Emergency Medicine

## 2019-08-25 ENCOUNTER — Encounter (HOSPITAL_COMMUNITY): Payer: Self-pay | Admitting: Emergency Medicine

## 2019-08-25 ENCOUNTER — Emergency Department (HOSPITAL_COMMUNITY): Payer: 59

## 2019-08-25 DIAGNOSIS — M25561 Pain in right knee: Secondary | ICD-10-CM | POA: Diagnosis not present

## 2019-08-25 DIAGNOSIS — Z5321 Procedure and treatment not carried out due to patient leaving prior to being seen by health care provider: Secondary | ICD-10-CM | POA: Diagnosis not present

## 2019-08-25 NOTE — ED Notes (Signed)
No answer in waiting room X2,  

## 2019-08-25 NOTE — ED Triage Notes (Signed)
Pt c/o of right knee pain x 4 days.  States having a " boil pop up and tried to squeeze it"

## 2019-08-25 NOTE — ED Triage Notes (Signed)
No answer in waiting area x 3rd attempt.

## 2019-11-03 ENCOUNTER — Ambulatory Visit: Payer: 59 | Admitting: Orthopaedic Surgery

## 2019-11-03 ENCOUNTER — Other Ambulatory Visit: Payer: Self-pay

## 2019-11-24 ENCOUNTER — Ambulatory Visit: Payer: Self-pay

## 2019-11-24 ENCOUNTER — Ambulatory Visit (INDEPENDENT_AMBULATORY_CARE_PROVIDER_SITE_OTHER): Payer: 59 | Admitting: Orthopaedic Surgery

## 2019-11-24 ENCOUNTER — Encounter: Payer: Self-pay | Admitting: Orthopaedic Surgery

## 2019-11-24 VITALS — BP 119/67 | HR 104 | Ht 68.0 in | Wt 235.0 lb

## 2019-11-24 DIAGNOSIS — M545 Low back pain, unspecified: Secondary | ICD-10-CM

## 2019-11-24 DIAGNOSIS — G8929 Other chronic pain: Secondary | ICD-10-CM

## 2019-11-24 NOTE — Progress Notes (Signed)
Office Visit Note   Patient: Gabriel Chapman           Date of Birth: [birth year]           MRN: [id number] Visit Date: 11/24/2019              Requested by: Practice, [facility] [address] [place],  [place] [address] PCP: Patient, No Pcp Per   Assessment & Plan: Visit Diagnoses:  1. Chronic bilateral low back pain, unspecified whether sciatica present   2. Chronic bilateral low back pain without sciatica     Plan: We will set patient up for his physical therapy recheck 5 weeks.  No evidence of radiculopathy on exam.  We discussed diet walking program and some weight loss.  Recheck 5 weeks.  Follow-Up Instructions: Return in about 5 weeks (around 12/29/2019).   Orders:  Orders Placed This Encounter  Procedures  . XR Lumbar Spine 2-3 Views  . Ambulatory referral to Physical Therapy   No orders of the defined types were placed in this encounter.     Procedures: No procedures performed   Clinical Data: No additional findings.   Subjective: Chief Complaint  Patient presents with  . 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  He does not recall the surgeon.  Patient denies any associated bowel bladder symptoms.  Review of Systems positive history of substance abuse, polysubstance.  Positive for depression.   Objective: Vital Signs: BP 119/67   Pulse (!) 104   Ht 5\' 8"  (1.727 m)   Wt 235 lb (106.6 kg)   BMI 35.73 kg/m   Physical Exam Constitutional:      Appearance: He is well-developed.  HENT:     Head: Normocephalic and atraumatic.  Eyes:     Pupils: Pupils are equal, round, and reactive to light.  Neck:     Thyroid: No thyromegaly.     Trachea: No tracheal deviation.    Cardiovascular:     Rate and Rhythm: Normal rate.  Pulmonary:     Effort: Pulmonary effort is normal.     Breath sounds: No wheezing.  Abdominal:     General: Bowel sounds are normal.     Palpations: Abdomen is soft.  Skin:    General: Skin is warm and dry.     Capillary Refill: Capillary refill takes less than 2 seconds.  Neurological:     Mental Status: He is alert and oriented to person, place, and time.  Psychiatric:        Behavior: Behavior normal.        Thought Content: Thought content normal.        Judgment: Judgment normal.     Ortho Exam patient is able to heel and toe walk.  Well-healed transverse incisions directly over the sacroiliac joints right and left.  There is some prominence of small area of adipose tissue on the left which is tender.  Negative FABER test negative logroll the hips knee and ankle jerk are 2+ and symmetrical no isolated motor weakness.  No atrophy.  Specialty Comments:  No specialty comments available.  Imaging: No results found.   PMFS History: Patient Active Problem List   Diagnosis Date Noted  .  Low back pain 11/24/2019  . Polysubstance (excluding opioids) dependence, daily use ([place]) 10/29/2018  . Severe major depression, single episode, without psychotic features ([place]) 10/27/2018  . Cocaine abuse ([place]) 10/25/2018  . Nausea and vomiting 10/11/2015  . Enteritis 10/11/2015  . ETOH abuse 10/11/2015  . Tobacco abuse 10/11/2015  . Leukocytosis 10/11/2015   Past Medical History:  Diagnosis Date  . Heart murmur   . History of back surgery    3 years     No Chapman history on file.  Past Surgical History:  Procedure Laterality Date  . BACK SURGERY     Social History   Occupational History  . Not on file  Tobacco Use  . Smoking status: Former Smoker    Packs/day: 0.25    Types: Cigarettes  . Smokeless tobacco: Current User    Types: Chew  Substance and Sexual Activity  . Alcohol use: Yes    Alcohol/week: 5.0 - 6.0  standard drinks    Types: 5 - 6 Cans of beer per week    Comment: "has drank for past 3 days" but "normally just on weekends"   . Drug use: Yes    Types: Marijuana    Comment: last use 1 week ago  . Sexual activity: Not on file

## 2019-11-29 ENCOUNTER — Encounter: Payer: Self-pay | Admitting: Internal Medicine

## 2019-12-14 ENCOUNTER — Ambulatory Visit: Payer: 59 | Admitting: Gastroenterology

## 2019-12-17 NOTE — Progress Notes (Signed)
Referring Provider: Forestine Na, ED Primary Care Physician:  Patient, No Pcp Per Primary Gastroenterologist:  Dr. Gala Romney  Chief Complaint  Patient presents with   Abdominal Pain    mid abd; had been drinking heavy   Nausea    occ vomiting   Diarrhea    occ    HPI:   Gabriel Chapman is a 24 y.o. male presenting today at the request of [facility] emergency department for abdominal pain.  History of polysubstance abuse (tobacco, alcohol, cocaine, marijuana).  Was seen in [facility] emergency department on 08/08/2019 for intermittent upper abdominal pain triggered by eating or drinking with associated nausea and vomiting x1.5 weeks.  Also with daily loose stools.  Reported remote history of Goody powder use for general aches and pains and headaches. Labs including CBC, CMP, and Lipase unrevealing. RUQ [place] normal.  He received antacids, antiemetics, and fluids in the ED. Advised to continue previously prescribed pepcid, pantoprazole, & promethazine and started carafate.   Today:  Abdominal Pain: Mid upper abdominal pain. Present for 3-4 months. Comes and goes. No radiation. Last about 10-15 minutes. Associated nausea. Occasional vomiting. Used to drink alcohol heavily. Would drink 18 pack a day plus pint of liquor. Currently drinking 6 beer every 2-3 weekends. If he drinks liquor, he will have abdominal pain and feel sick all day the next day. Spicy foods will cause worsening abdominal pain. Will have abdominal pain at times even without eating spicy foods or drinking alcohol. Has tried cutting down on fried/fatty foods. Taking BC powders for headaches. About 2 a month. Occasional ibuprofen.   Has bad acid reflux. During the day most days. Takes tums as needed. Ran out of nausea medication and protonix. Took protonix for 2 weeks, then stopped and only took it when symptoms were severe. Will wake up at night with acid in his throat. Will get sick from it sometimes. When he vomits, there is blood in  the vomit at times. First noticed this about 1-2 months ago. Vomits about twice a week. If he vomits the first time, there is no blood. If he vomits again, there is blood. Blood is bright red. No coffee ground emesis. No black vomit. Admits to dysphagia. Feels like he is choking at times. Present for at least 5 years. Has had to bring food back up in the past. Typically will try to get it down with water. Worsening. Occurs 3 times a week. Typically with bread and meats. No trouble with liquids or soft foods.   BMs daily. No constiaption. Sometimes with diarrhea. Stools will turn to water. About once a week. Spicy foods will cause diarrhea. Can have up to 5 stools a day. Has woken up in the middle of the night to have a BM. Associated abdominal pain. Cramping prior to BMs and resolves and resolves thereafter. States he loves dairy. Loves milk. No family history of celiac disease. No blood in the stool. No black stools. Present for 2-3 months.   Reports losing about 20 lbs in the last 4 months. Diet has been limited due to pain and nausea. Has also limited fried/fatty and spicy foods. Cur back significantly on alcohol.    Denies fever, chills, lightheadedness, dizziness, presyncope, or syncope.  Denies chest pain.  Reports he can feel his heart "skip a beat" every now and then with known heart murmur diagnosed 2 years ago.  Cannot remember what the murmur is.  Denies shortness of breath or cough.  Past Medical  History:  Diagnosis Date   Heart murmur    History of back surgery    3 years     Past Surgical History:  Procedure Laterality Date   BACK SURGERY      Current Outpatient Medications  Medication Sig Dispense Refill   cyclobenzaprine (FLEXERIL) 10 MG tablet Take 10 mg by mouth 3 (three) times daily as needed.     ondansetron (ZOFRAN) 4 MG tablet Take 1 tablet (4 mg total) by mouth every 8 (eight) hours as needed for nausea or vomiting. 30 tablet 1   pantoprazole (PROTONIX) 40 MG  tablet Take 1 tablet (40 mg total) by mouth 2 (two) times daily before a meal. 60 tablet 5   No current facility-administered medications for this visit.    Allergies as of 12/19/2019   (No Known Allergies)    Family History  Problem Relation Age of Onset   Prostate cancer Paternal Grandfather    Colon cancer Neg Hx     Social History   Socioeconomic History   Marital status: Single    Spouse name: Not on file   Number of children: Not on file   Years of education: Not on file   Highest education level: Not on file  Occupational History   Not on file  Tobacco Use   Smoking status: Current Every Day Smoker    Packs/day: 0.25    Types: Cigarettes   Smokeless tobacco: Current User    Types: Chew  Substance and Sexual Activity   Alcohol use: Yes    Alcohol/week: 5.0 - 6.0 standard drinks    Types: 5 - 6 Cans of beer per week    Comment: "has drank for past 3 days" but "normally just on weekends" ; 12/19/19 occ beer   Drug use: Not Currently    Types: Marijuana, Cocaine    Comment: Last used drugs 1-2 years ago (12/19/19)   Sexual activity: Not on file  Other Topics Concern   Not on file  Social History Narrative   Not on file   Social Determinants of Health   Financial Resource Strain:    Difficulty of Paying Living Expenses: Not on file  Food Insecurity:    Worried About Running Out of Food in the Last Year: Not on file   [employer] of Food in the Last Year: Not on file  Transportation Needs:    Lack of Transportation (Medical): Not on file   Lack of Transportation (Non-Medical): Not on file  Physical Activity:    Days of Exercise per Week: Not on file   Minutes of Exercise per Session: Not on file  Stress:    Feeling of Stress : Not on file  Social Connections:    Frequency of Communication with Friends and Family: Not on file   Frequency of Social Gatherings with Friends and Family: Not on file   Attends Religious Services: Not on  file   Active Member of Clubs or Organizations: Not on file   Attends Banker Meetings: Not on file   Marital Status: Not on file  Intimate Partner Violence:    Fear of Current or Ex-Partner: Not on file   Emotionally Abused: Not on file   Physically Abused: Not on file   Sexually Abused: Not on file    Review of Systems: Gen: See HPI CV: See HPI  Resp: See HPI GI: See HPI GU : Denies urinary burning, urinary frequency, urinary hesitancy MS: Admits to joint pain in  his knees, hands, and back.  Derm: Denies rash Psych: Admits to anxiety. No depression.  Heme: See HPI  Physical Exam: BP 140/71    Pulse 88    Temp (!) 97.3 F (36.3 C) (Temporal)    Ht 5\' 8"  (1.727 m)    Wt 223 lb 12.8 oz (101.5 kg)    BMI 34.03 kg/m  General:   Alert and oriented. Pleasant and cooperative. Well-nourished and well-developed.  Head:  Normocephalic and atraumatic. Eyes:  Without icterus, sclera clear and conjunctiva pink.  Ears:  Normal auditory acuity. Lungs:  Clear to auscultation bilaterally. No wheezes, rales, or rhonchi. No distress.  Heart:  S1, S2 present without murmurs appreciated.  Abdomen:  +BS, soft, non-distended.  Mild tenderness to palpation in the epigastric area.  Also with mild tenderness to palpation in the lower abdomen.  No HSM noted. No guarding or rebound. No masses appreciated.  Rectal:  Deferred  Msk:  Symmetrical without gross deformities. Normal posture. Extremities:  Without edema. Neurologic:  Alert and  oriented x4;  grossly normal neurologically. Skin:  Intact without significant lesions or rashes. Psych: Normal mood and affect.

## 2019-12-19 ENCOUNTER — Ambulatory Visit: Payer: 59 | Admitting: Gastroenterology

## 2019-12-19 ENCOUNTER — Other Ambulatory Visit: Payer: Self-pay

## 2019-12-19 ENCOUNTER — Encounter: Payer: Self-pay | Admitting: Internal Medicine

## 2019-12-19 ENCOUNTER — Encounter: Payer: Self-pay | Admitting: Gastroenterology

## 2019-12-19 VITALS — BP 140/71 | HR 88 | Temp 97.3°F | Ht 68.0 in | Wt 223.8 lb

## 2019-12-19 DIAGNOSIS — R197 Diarrhea, unspecified: Secondary | ICD-10-CM

## 2019-12-19 DIAGNOSIS — R1310 Dysphagia, unspecified: Secondary | ICD-10-CM

## 2019-12-19 DIAGNOSIS — R1013 Epigastric pain: Secondary | ICD-10-CM

## 2019-12-19 DIAGNOSIS — K920 Hematemesis: Secondary | ICD-10-CM

## 2019-12-19 DIAGNOSIS — K219 Gastro-esophageal reflux disease without esophagitis: Secondary | ICD-10-CM | POA: Diagnosis not present

## 2019-12-19 MED ORDER — ONDANSETRON HCL 4 MG PO TABS: 4.0000 mg | ORAL_TABLET | Freq: Three times a day (TID) | ORAL | 1 refills | Status: AC | PRN

## 2019-12-19 MED ORDER — PANTOPRAZOLE SODIUM 40 MG PO TBEC: 40.0000 mg | DELAYED_RELEASE_TABLET | Freq: Two times a day (BID) | ORAL | 5 refills | Status: AC

## 2019-12-19 NOTE — Assessment & Plan Note (Addendum)
24 year old male with history of polysubstance abuse, alcohol abuse with 3-13-month history of epigastric abdominal pain, nausea, and occasional vomiting with intermittent hematemesis.  Also reports chronic GERD symptoms daily, worse at night and worsening solid food dysphagia that has been present for at least 5 years.  Denies bright red blood per rectum or melena.  Lost about 27 pounds in the last 4 months, but he has also made dietary changes including limiting fried, fatty, greasy, spicy foods, and significantly decreasing alcohol consumption from 18 pack of beer and 1 pint of liquor daily to 6 beer every 2-3 weekends. If he tries to drink liquor, he has significant worsening of abdominal pain. Occasional ibuprofen or BC powder use.  He was seen at [facility] ED for similar symptoms in September 2020.  Right upper quadrant ultrasound was normal.  CBC, CMP, and lipase unrevealing.   Concern for alcohol or NSAID induced gastritis, esophagitis, duodenitis.  Very possible he has PUD.  Hematemesis could also be secondary to PUD, esophagitis, Mallory-Weiss tear secondary to vomiting.   Update CBC, CMP Start Protonix 40 mg twice daily 30 minutes before breakfast and dinner. Zofran 4 mg every 8 hours as needed for nausea/vomiting. Avoid all NSAIDs. Continue working towards abstinence of alcohol. Avoid fried, fatty, greasy, spicy, citrus foods.  Avoid carbonated beverages, caffeine, and chocolate.  Do not eat within 3 hours of laying down.  Prop head of bed up to create a 6 inch incline. Proceed with EGD +/- dilation with propofol with Dr. Jena Gauss in the near future. The risks, benefits, and alternatives have been discussed in detail with patient. They have stated understanding and desire to proceed.  UDS at preop. Advised if he develops frequent vomiting of blood, lightheadedness, dizziness, feeling like he will pass out, or something were to get hung in his esophagus and not come up or go down, he should  proceed to the emergency department. Follow-up after EGD.  Call if questions or concerns prior.

## 2019-12-19 NOTE — Patient Instructions (Addendum)
Please have labs completed.  We will get you scheduled for an upper endoscopy with possible dilation of your esophagus in the near future with Dr. Jena Gauss.  Start Protonix 40 mg twice daily 30 minutes before breakfast and 30 minutes before dinner.  Zofran 4 mg every 8 hours as needed for nausea/vomiting.  Continue cutting back on alcohol and work towards abstinence.  Avoid all NSAID products including ibuprofen, Aleve, Advil, Goody powders, naproxen.  Follow GERD diet.  Avoid fried, fatty, greasy, spicy, citrus foods.  Avoid carbonated beverages, caffeine, and chocolate.  Do not eat within 3 hours of laying down.  Prop the head of your bed up with blood, breaks, or risers to create a 6 inch incline.  Continue monitoring your symptoms.  If you develop frequent vomiting of blood, have symptoms of feeling like you will pass out, significant fatigue, or something gets hung in your esophagus and will not come up or go down, you should proceed to the emergency department.  For your diarrhea, follow a lactose-free diet or take Lactaid pills prior to consuming any dairy products.   We will plan to follow-up with you after your procedure.  If you have questions or concerns prior, do not hesitate to call.  Ermalinda Memos, PA-C [facility]    Food Choices for Gastroesophageal Reflux Disease, Adult When you have gastroesophageal reflux disease (GERD), the foods you eat and your eating habits are very important. Choosing the right foods can help ease the discomfort of GERD. Consider working with a diet and nutrition specialist (dietitian) to help you make healthy food choices. What general guidelines should I follow?  Eating plan  Choose healthy foods low in fat, such as fruits, vegetables, whole grains, low-fat dairy products, and lean meat, fish, and poultry.  Eat frequent, small meals instead of three large meals each day. Eat your meals slowly, in a relaxed setting. Avoid  bending over or lying down until 2-3 hours after eating.  Limit high-fat foods such as fatty meats or fried foods.  Limit your intake of oils, butter, and shortening to less than 8 teaspoons each day.  Avoid the following: ? Foods that cause symptoms. These may be different for different people. Keep a food diary to keep track of foods that cause symptoms. ? Alcohol. ? Drinking large amounts of liquid with meals. ? Eating meals during the 2-3 hours before bed.  Cook foods using methods other than frying. This may include baking, grilling, or broiling. Lifestyle  Maintain a healthy weight. Ask your health care provider what weight is healthy for you. If you need to lose weight, work with your health care provider to do so safely.  Exercise for at least 30 minutes on 5 or more days each week, or as told by your health care provider.  Avoid wearing clothes that fit tightly around your waist and chest.  Do not use any products that contain nicotine or tobacco, such as cigarettes and e-cigarettes. If you need help quitting, ask your health care provider.  Sleep with the head of your bed raised. Use a wedge under the mattress or blocks under the bed frame to raise the head of the bed. What foods are not recommended? The items listed may not be a complete list. Talk with your dietitian about what dietary choices are best for you. Grains Pastries or quick breads with added fat. [place] toast. Vegetables Deep fried vegetables. [place] fries. Any vegetables prepared with added fat. Any vegetables that cause symptoms.  For some people this may include tomatoes and tomato products, chili peppers, onions and garlic, and horseradish. Fruits Any fruits prepared with added fat. Any fruits that cause symptoms. For some people this may include citrus fruits, such as oranges, grapefruit, pineapple, and lemons. Meats and other protein foods High-fat meats, such as fatty beef or pork, hot dogs, ribs, ham,  sausage, salami and bacon. Fried meat or protein, including fried fish and fried chicken. Nuts and nut butters. Dairy Whole milk and chocolate milk. Sour cream. Cream. Ice cream. Cream cheese. Milk shakes. Beverages Coffee and tea, with or without caffeine. Carbonated beverages. Sodas. Energy drinks. Fruit juice made with acidic fruits (such as orange or grapefruit). Tomato juice. Alcoholic drinks. Fats and oils Butter. Margarine. Shortening. Ghee. Sweets and desserts Chocolate and cocoa. Donuts. Seasoning and other foods Pepper. Peppermint and spearmint. Any condiments, herbs, or seasonings that cause symptoms. For some people, this may include curry, hot sauce, or vinegar-based salad dressings. Summary  When you have gastroesophageal reflux disease (GERD), food and lifestyle choices are very important to help ease the discomfort of GERD.  Eat frequent, small meals instead of three large meals each day. Eat your meals slowly, in a relaxed setting. Avoid bending over or lying down until 2-3 hours after eating.  Limit high-fat foods such as fatty meat or fried foods. This information is not intended to replace advice given to you by your health care provider. Make sure you discuss any questions you have with your health care provider. Document Revised: 03/03/2019 Document Reviewed: 11/11/2016 Elsevier Patient Education  2020 Elsevier Inc.  Lactose-Free Diet, Adult If you have lactose intolerance, you are not able to digest lactose. Lactose is a natural sugar found mainly in dairy milk and dairy products. You may need to avoid all foods and beverages that contain lactose. A lactose-free diet can help you do this. Which foods have lactose? Lactose is found in dairy milk and dairy products, such as:  Yogurt.  Cheese.  Butter.  Margarine.  Sour cream.  Cream.  Whipped toppings and nondairy creamers.  Ice cream and other dairy-based desserts. Lactose is also found in foods or  products made with dairy milk or milk ingredients. To find out whether a food contains dairy milk or a milk ingredient, look at the ingredients list. Avoid foods with the statement "May contain milk" and foods that contain:  Milk powder.  Whey.  Curd.  Caseinate.  Lactose.  Lactalbumin.  Lactoglobulin. What are alternatives to dairy milk and foods made with milk products?  Lactose-free milk.  Soy milk with added calcium and vitamin D.  Almond milk, coconut milk, rice milk, or other nondairy milk alternatives with added calcium and vitamin D. Note that these are low in protein.  Soy products, such as soy yogurt, soy cheese, soy ice cream, and soy-based sour cream.  Other nut milk products, such as almond yogurt, almond cheese, cashew yogurt, cashew cheese, cashew ice cream, coconut yogurt, and coconut ice cream. What are tips for following this plan?  Do not consume foods, beverages, vitamins, minerals, or medicines containing lactose. Read ingredient lists carefully.  Look for the words "lactose-free" on labels.  Use lactase enzyme drops or tablets as directed by your health care provider.  Use lactose-free milk or a milk alternative, such as soy milk or almond milk, for drinking and cooking.  Make sure you get enough calcium and vitamin D in your diet. A lactose-free eating plan can be lacking in these important  nutrients.  Take calcium and vitamin D supplements as directed by your health care provider. Talk to your health care provider about supplements if you are not able to get enough calcium and vitamin D from food. What foods can I eat?  Fruits All fresh, canned, frozen, or dried fruits that are not processed with lactose. Vegetables All fresh, frozen, and canned vegetables without cheese, cream, or butter sauces. Grains Any that are not made with dairy milk or dairy products. Meats and other proteins Any meat, fish, poultry, and other protein sources that are  not made with dairy milk or dairy products. Soy cheese and yogurt. Fats and oils Any that are not made with dairy milk or dairy products. Beverages Lactose-free milk. Soy, rice, or almond milk with added calcium and vitamin D. Fruit and vegetable juices. Sweets and desserts Any that are not made with dairy milk or dairy products. Seasonings and condiments Any that are not made with dairy milk or dairy products. Calcium Calcium is found in many foods that contain lactose and is important for bone health. The amount of calcium you need depends on your age:  Adults younger than 50 years: 1,000 mg of calcium a day.  Adults older than 50 years: 1,200 mg of calcium a day. If you are not getting enough calcium, you may get it from other sources, including:  Orange juice with calcium added. There are 300-350 mg of calcium in 1 cup of orange juice.  Calcium-fortified soy milk. There are 300-400 mg of calcium in 1 cup of calcium-fortified soy milk.  Calcium-fortified rice or almond milk. There are 300 mg of calcium in 1 cup of calcium-fortified rice or almond milk.  Calcium-fortified breakfast cereals. There are 100-1,000 mg of calcium in calcium-fortified breakfast cereals.  Spinach, cooked. There are 145 mg of calcium in  cup of cooked spinach.  Edamame, cooked. There are 130 mg of calcium in  cup of cooked edamame.  Collard greens, cooked. There are 125 mg of calcium in  cup of cooked collard greens.  Kale, frozen or cooked. There are 90 mg of calcium in  cup of cooked or frozen kale.  Almonds. There are 95 mg of calcium in  cup of almonds.  Broccoli, cooked. There are 60 mg of calcium in 1 cup of cooked broccoli. The items listed above may not be a complete list of recommended foods and beverages. Contact a dietitian for more options. What foods are not recommended? Fruits None, unless they are made with dairy milk or dairy products. Vegetables None, unless they are made with  dairy milk or dairy products. Grains Any grains that are made with dairy milk or dairy products. Meats and other proteins None, unless they are made with dairy milk or dairy products. Dairy All dairy products, including milk, goat's milk, buttermilk, kefir, acidophilus milk, flavored milk, evaporated milk, condensed milk, dulce de [place], eggnog, yogurt, cheese, and cheese spreads. Fats and oils Any that are made with milk or milk products. Margarines and salad dressings that contain milk or cheese. Cream. Half and half. Cream cheese. Sour cream. Chip dips made with sour cream or yogurt. Beverages Hot chocolate. Cocoa with lactose. Instant iced teas. Powdered fruit drinks. Smoothies made with dairy milk or yogurt. Sweets and desserts Any that are made with milk or milk products. Seasonings and condiments Chewing gum that has lactose. Spice blends if they contain lactose. Artificial sweeteners that contain lactose. Nondairy creamers. The items listed above may not be a  complete list of foods and beverages to avoid. Contact a dietitian for more information. Summary  If you are lactose intolerant, it means that you have a hard time digesting lactose, a natural sugar found in milk and milk products.  Following a lactose-free diet can help you manage this condition.  Calcium is important for bone health and is found in many foods that contain lactose. Talk with your health care provider about other sources of calcium. This information is not intended to replace advice given to you by your health care provider. Make sure you discuss any questions you have with your health care provider. Document Revised: 12/08/2017 Document Reviewed: 12/08/2017 Elsevier Patient Education  2020 [employer].

## 2019-12-19 NOTE — Patient Instructions (Signed)
PA for EGD/DIL submitted via [facility] website. Case approved. PA# [id number], valid 01/02/20-04/01/20.

## 2019-12-19 NOTE — Assessment & Plan Note (Signed)
Addressed under abdominal pain, epigastric. 

## 2019-12-19 NOTE — Assessment & Plan Note (Addendum)
24 year old male reporting 5-year history of dysphagia that is increasing in frequency.  Occurring about 3 times a week.  Typically with breads and meats.  No trouble with soft foods or liquids.  This is in the setting of daily GERD symptoms which I suspect is likely secondary to dietary habits.  Historically, patient has drank 18 beers and 1 pint of liquor daily.  Recently decreased this to 6 beers every 2-3 weekends due to epigastric abdominal pain, nausea, and vomiting with intermittent hematemesis as discussed below.  Suspect dysphagia secondary to chronic uncontrolled GERD with development of esophageal web, ring, or stricture.  Less likely malignancy.  Update CBC, CMP Start Protonix 40 mg twice daily 30 minutes before breakfast and dinner. Zofran 4 mg every 8 hours as needed for nausea/vomiting. Avoid all NSAIDs. Continue working towards abstinence of alcohol. Avoid fried, fatty, greasy, spicy, citrus foods.  Avoid carbonated beverages, caffeine, and chocolate.  Do not eat within 3 hours of laying down.  Prop head of bed up to create a 6 inch incline. Proceed with EGD +/- dilation with propofol with Dr. Jena Gauss in the near future. The risks, benefits, and alternatives have been discussed in detail with patient. They have stated understanding and desire to proceed.  UDS at preop. Advised if he develops frequent vomiting of blood, lightheadedness, dizziness, feeling like he will pass out, or something were to get hung in his esophagus and not come up or go down, he should proceed to the emergency department. Follow-up after EGD.

## 2019-12-19 NOTE — Assessment & Plan Note (Addendum)
Intermittent watery diarrhea about once a week typically in the setting of spicy foods.  Admits he "loves dairy" and has not noted any worsening of diarrhea with dairy products.  With diarrhea, he will have associated lower abdominal pain prior to BMs that resolves thereafter.  Otherwise, BMs daily without constipation.  Denies bright red blood per rectum or melena.  He has lost about 27 pounds in the last 4 months which I suspect is likely related to significant dietary changes in the setting of upper abdominal pain that started 4 months ago.  He has since limited fried/fatty foods, spicy foods, and decreased alcohol intake from 18 beer and 1 pint of liquor daily to 6 beer every 2-3 weekends.   Suspect his intermittent diarrhea is likely secondary to food intolerances.  He was advised to avoid trigger foods.  Follow a lactose-free diet or take Lactaid pills prior to consuming dairy.  We will also check celiac serologies and TSH to ensure this is not contributing. Follow-up after EGD.  Call if questions or concerns prior.

## 2019-12-19 NOTE — Assessment & Plan Note (Addendum)
24 year old male who reports chronic history of reflux occurring most days of the week.  Nocturnal symptoms are the worst.  Also with chronic solid food dysphagia x5 years that is worsening, occurring about 3 times a week.  Additionally, he has 3-89-month history of epigastric abdominal pain, nausea, and occasional vomiting with intermittent hematemesis as discussed below.  History significant for polysubstance abuse with cocaine, marijuana, and alcohol abuse.  Denies drug use in the last 1-2 years.  Prior to onset of abdominal pain, patient reports drinking 18 pack of beer +1 pint of liquor daily.  Has cut back to 6 beers every 2-3 weekends.  Occasional BC powders or ibuprofen.  Denies bright red blood per rectum or melena.  Suspect his GERD symptoms are likely related to his dietary habits.  Concern for gastritis, esophagitis, duodenitis, PUD, esophageal web, ring, stricture.   Update CBC, CMP Start Protonix 40 mg twice daily 30 minutes before breakfast and dinner. Zofran 4 mg every 8 hours as needed for nausea/vomiting. Avoid all NSAIDs. Continue working towards abstinence of alcohol. Avoid fried, fatty, greasy, spicy, citrus foods.  Avoid carbonated beverages, caffeine, and chocolate.  Do not eat within 3 hours of laying down.  Prop head of bed up to create a 6 inch incline. Proceed with EGD +/- dilation with propofol with Dr. Jena Gauss in the near future. The risks, benefits, and alternatives have been discussed in detail with patient. They have stated understanding and desire to proceed.  UDS at preop. Advised if he develops frequent vomiting of blood, lightheadedness, dizziness, feeling like he will pass out, or something were to get hung in his esophagus and not come up or go down, he should proceed to the emergency department. Follow-up after EGD.  Call if questions or concerns prior.

## 2019-12-20 LAB — COMPLETE METABOLIC PANEL WITH GFR
AG Ratio: 2 (calc) (ref 1.0–2.5)
ALT: 24 U/L (ref 9–46)
AST: 16 U/L (ref 10–40)
Albumin: 4.7 g/dL (ref 3.6–5.1)
Alkaline phosphatase (APISO): 59 U/L (ref 36–130)
BUN/Creatinine Ratio: 20 (calc) (ref 6–22)
BUN: 11 mg/dL (ref 7–25)
CO2: 25 mmol/L (ref 20–32)
Calcium: 9.4 mg/dL (ref 8.6–10.3)
Chloride: 108 mmol/L (ref 98–110)
Creat: 0.56 mg/dL — ABNORMAL LOW (ref 0.60–1.35)
GFR, Est African American: 170 mL/min/{1.73_m2} (ref >=60)
GFR, Est Non African American: 147 mL/min/{1.73_m2} (ref >=60)
Globulin: 2.3 g/dL (calc) (ref 1.9–3.7)
Glucose, Bld: 93 mg/dL (ref 65–99)
Potassium: 3.9 mmol/L (ref 3.5–5.3)
Sodium: 140 mmol/L (ref 135–146)
Total Bilirubin: 0.5 mg/dL (ref 0.2–1.2)
Total Protein: 7 g/dL (ref 6.1–8.1)

## 2019-12-20 LAB — CBC WITH DIFFERENTIAL/PLATELET
Absolute Monocytes: 462 cells/uL (ref 200–950)
Basophils Absolute: 47 cells/uL (ref 0–200)
Basophils Relative: 0.7 %
Eosinophils Absolute: 127 cells/uL (ref 15–500)
Eosinophils Relative: 1.9 %
HCT: 43.1 % (ref 38.5–50.0)
Hemoglobin: 14.9 g/dL (ref 13.2–17.1)
Lymphs Abs: 2332 cells/uL (ref 850–3900)
MCH: 31.7 pg (ref 27.0–33.0)
MCHC: 34.6 g/dL (ref 32.0–36.0)
MCV: 91.7 fL (ref 80.0–100.0)
MPV: 10 fL (ref 7.5–12.5)
Monocytes Relative: 6.9 %
Neutro Abs: 3732 cells/uL (ref 1500–7800)
Neutrophils Relative %: 55.7 %
Platelets: 181 10*3/uL (ref 140–400)
RBC: 4.7 10*6/uL (ref 4.20–5.80)
RDW: 12.9 % (ref 11.0–15.0)
Total Lymphocyte: 34.8 %
WBC: 6.7 10*3/uL (ref 3.8–10.8)

## 2019-12-20 LAB — TSH: TSH: 0.41 mIU/L (ref 0.40–4.50)

## 2019-12-20 LAB — IGA: Immunoglobulin A: 140 mg/dL (ref 47–310)

## 2019-12-20 LAB — TISSUE TRANSGLUTAMINASE, IGA: (tTG) Ab, IgA: 1 U/mL

## 2019-12-20 NOTE — H&P (View-Only) (Signed)
Overall, labs look good.  CBC is normal with hemoglobin 14.9 (no anemia in light of hematemesis).  Kidney function, electrolytes, liver function test normal.  No evidence of celiac disease.  Thyroid function is within normal limits but on the very low end of normal at 0.41 (low end of normal is 0.4).  Patient needs to establish with a PCP for routine care as well as follow-up of thyroid function.   Otherwise, continue with recommendations made at time of office visit and EGD as scheduled.

## 2019-12-20 NOTE — Progress Notes (Signed)
No pcp per patient 

## 2019-12-20 NOTE — Progress Notes (Signed)
Overall, labs look good.  CBC is normal with hemoglobin 14.9 (no anemia in light of hematemesis).  Kidney function, electrolytes, liver function test normal.  No evidence of celiac disease.  Thyroid function is within normal limits but on the very low end of normal at 0.41 (low end of normal is 0.4).  Patient needs to establish with a PCP for routine care as well as follow-up of thyroid function.   Otherwise, continue with recommendations made at time of office visit and EGD as scheduled.

## 2019-12-22 ENCOUNTER — Ambulatory Visit: Payer: 59 | Admitting: Orthopaedic Surgery

## 2019-12-28 NOTE — Patient Instructions (Signed)
Gabriel Chapman  12/28/2019     @PREFPERIOPPHARMACY @   Your procedure is scheduled on  01/02/2020.  Report to 03/01/2020 at  0915  A.M.  Call this number if you have problems the morning of surgery:  [phone]   Remember:  Follow the diet instructions given to you by Dr [phone] office.                      Take these medicines the morning of surgery with A SIP OF WATER  Flexeril(if needed), zofran (if needed), protonix/    Do not wear jewelry, make-up or nail polish.  Do not wear lotions, powders, or perfumes. Please wear deodorant and brush your teeth.  Do not shave 48 hours prior to surgery.  Men may shave face and neck.  Do not bring valuables to the hospital.  [facility] is not responsible for any belongings or valuables.  Contacts, dentures or bridgework may not be worn into surgery.  Leave your suitcase in the car.  After surgery it may be brought to your room.  For patients admitted to the hospital, discharge time will be determined by your treatment team.  Patients discharged the day of surgery will not be allowed to drive home.   Name and phone number of your driver:   family Special instructions:  None  Please read over the following fact sheets that you were given. Anesthesia Post-op Instructions and Care and Recovery After Surgery       Upper Endoscopy, Adult, Care After This sheet gives you information about how to care for yourself after your procedure. Your health care provider may also give you more specific instructions. If you have problems or questions, contact your health care provider. What can I expect after the procedure? After the procedure, it is common to have:  A sore throat.  Mild stomach pain or discomfort.  Bloating.  Nausea. Follow these instructions at home:   Follow instructions from your health care provider about what to eat or drink after your procedure.  Return to your normal activities as told by your health  care provider. Ask your health care provider what activities are safe for you.  Take over-the-counter and prescription medicines only as told by your health care provider.  Do not drive for 24 hours if you were given a sedative during your procedure.  Keep all follow-up visits as told by your health care provider. This is important. Contact a health care provider if you have:  A sore throat that lasts longer than one day.  Trouble swallowing. Get help right away if:  You vomit blood or your vomit looks like coffee grounds.  You have: ? A fever. ? Bloody, black, or tarry stools. ? A severe sore throat or you cannot swallow. ? Difficulty breathing. ? Severe pain in your chest or abdomen. Summary  After the procedure, it is common to have a sore throat, mild stomach discomfort, bloating, and nausea.  Do not drive for 24 hours if you were given a sedative during the procedure.  Follow instructions from your health care provider about what to eat or drink after your procedure.  Return to your normal activities as told by your health care provider. This information is not intended to replace advice given to you by your health care provider. Make sure you discuss any questions you have with your health care provider. Document Revised: 05/04/2018 Document Reviewed: 04/12/2018  Elsevier Patient Education  [employer].  Esophageal Dilatation Esophageal dilatation, also called esophageal dilation, is a procedure to widen or open (dilate) a blocked or narrowed part of the esophagus. The esophagus is the part of the body that moves food and liquid from the mouth to the stomach. You may need this procedure if:  You have a buildup of scar tissue in your esophagus that makes it difficult, painful, or impossible to swallow. This can be caused by gastroesophageal reflux disease (GERD).  You have cancer of the esophagus.  There is a problem with how food moves through your esophagus. In  some cases, you may need this procedure repeated at a later time to dilate the esophagus gradually. Tell a health care provider about:  Any allergies you have.  All medicines you are taking, including vitamins, herbs, eye drops, creams, and over-the-counter medicines.  Any problems you or family members have had with anesthetic medicines.  Any blood disorders you have.  Any surgeries you have had.  Any medical conditions you have.  Any antibiotic medicines you are required to take before dental procedures.  Whether you are pregnant or may be pregnant. What are the risks? Generally, this is a safe procedure. However, problems may occur, including:  Bleeding due to a tear in the lining of the esophagus.  A hole (perforation) in the esophagus. What happens before the procedure?  Follow instructions from your health care provider about eating or drinking restrictions.  Ask your health care provider about changing or stopping your regular medicines. This is especially important if you are taking diabetes medicines or blood thinners.  Plan to have someone take you home from the hospital or clinic.  Plan to have a responsible adult care for you for at least 24 hours after you leave the hospital or clinic. This is important. What happens during the procedure?  You may be given a medicine to help you relax (sedative).  A numbing medicine may be sprayed into the back of your throat, or you may gargle the medicine.  Your health care provider may perform the dilatation using various surgical instruments, such as: ? Simple dilators. This instrument is carefully placed in the esophagus to stretch it. ? Guided wire bougies. This involves using an endoscope to insert a wire into the esophagus. A dilator is passed over this wire to enlarge the esophagus. Then the wire is removed. ? Balloon dilators. An endoscope with a small balloon at the end is inserted into the esophagus. The balloon is  inflated to stretch the esophagus and open it up. The procedure may vary among health care providers and hospitals. What happens after the procedure?  Your blood pressure, heart rate, breathing rate, and blood oxygen level will be monitored until the medicines you were given have worn off.  Your throat may feel slightly sore and numb. This will improve slowly over time.  You will not be allowed to eat or drink until your throat is no longer numb.  When you are able to drink, urinate, and sit on the edge of the bed without nausea or dizziness, you may be able to return home. Follow these instructions at home:  Take over-the-counter and prescription medicines only as told by your health care provider.  Do not drive for 24 hours if you were given a sedative during your procedure.  You should have a responsible adult with you for 24 hours after the procedure.  Follow instructions from your health care provider  about any eating or drinking restrictions.  Do not use any products that contain nicotine or tobacco, such as cigarettes and e-cigarettes. If you need help quitting, ask your health care provider.  Keep all follow-up visits as told by your health care provider. This is important. Get help right away if you:  Have a fever.  Have chest pain.  Have pain that is not relieved by medication.  Have trouble breathing.  Have trouble swallowing.  Vomit blood. Summary  Esophageal dilatation, also called esophageal dilation, is a procedure to widen or open (dilate) a blocked or narrowed part of the esophagus.  Plan to have someone take you home from the hospital or clinic.  For this procedure, a numbing medicine may be sprayed into the back of your throat, or you may gargle the medicine.  Do not drive for 24 hours if you were given a sedative during your procedure. This information is not intended to replace advice given to you by your health care provider. Make sure you discuss  any questions you have with your health care provider. Document Revised: 09/07/2019 Document Reviewed: 09/15/2017 Elsevier Patient Education  2020 [employer] After These instructions provide you with information about caring for yourself after your procedure. Your health care provider may also give you more specific instructions. Your treatment has been planned according to current medical practices, but problems sometimes occur. Call your health care provider if you have any problems or questions after your procedure. What can I expect after the procedure? After your procedure, you may:  Feel sleepy for several hours.  Feel clumsy and have poor balance for several hours.  Feel forgetful about what happened after the procedure.  Have poor judgment for several hours.  Feel nauseous or vomit.  Have a sore throat if you had a breathing tube during the procedure. Follow these instructions at home: For at least 24 hours after the procedure:      Have a responsible adult stay with you. It is important to have someone help care for you until you are awake and alert.  Rest as needed.  Do not: ? Participate in activities in which you could fall or become injured. ? Drive. ? Use heavy machinery. ? Drink alcohol. ? Take sleeping pills or medicines that cause drowsiness. ? Make important decisions or sign legal documents. ? Take care of children on your own. Eating and drinking  Follow the diet that is recommended by your health care provider.  If you vomit, drink water, juice, or soup when you can drink without vomiting.  Make sure you have little or no nausea before eating solid foods. General instructions  Take over-the-counter and prescription medicines only as told by your health care provider.  If you have sleep apnea, surgery and certain medicines can increase your risk for breathing problems. Follow instructions from your health care  provider about wearing your sleep device: ? Anytime you are sleeping, including during daytime naps. ? While taking prescription pain medicines, sleeping medicines, or medicines that make you drowsy.  If you smoke, do not smoke without supervision.  Keep all follow-up visits as told by your health care provider. This is important. Contact a health care provider if:  You keep feeling nauseous or you keep vomiting.  You feel light-headed.  You develop a rash.  You have a fever. Get help right away if:  You have trouble breathing. Summary  For several hours after your procedure, you may feel  sleepy and have poor judgment.  Have a responsible adult stay with you for at least 24 hours or until you are awake and alert. This information is not intended to replace advice given to you by your health care provider. Make sure you discuss any questions you have with your health care provider. Document Revised: 02/08/2018 Document Reviewed: 03/02/2016 Elsevier Patient Education  [employer] [place].

## 2019-12-30 ENCOUNTER — Encounter (HOSPITAL_COMMUNITY): Payer: Self-pay

## 2019-12-30 ENCOUNTER — Other Ambulatory Visit (HOSPITAL_COMMUNITY)
Admission: RE | Admit: 2019-12-30 | Discharge: 2019-12-30 | Disposition: A | Discharge status: Home / Self Care | Payer: 59 | Source: Ambulatory Visit | Attending: Internal Medicine | Admitting: Internal Medicine

## 2019-12-30 ENCOUNTER — Encounter (HOSPITAL_COMMUNITY)
Admission: RE | Admit: 2019-12-30 | Discharge: 2019-12-30 | Disposition: A | Discharge status: Home / Self Care | Payer: 59 | Source: Ambulatory Visit | Attending: Internal Medicine | Admitting: Internal Medicine

## 2019-12-30 ENCOUNTER — Other Ambulatory Visit: Payer: Self-pay

## 2019-12-30 DIAGNOSIS — Z01812 Encounter for preprocedural laboratory examination: Secondary | ICD-10-CM | POA: Insufficient documentation

## 2019-12-30 DIAGNOSIS — Z20822 Contact with and (suspected) exposure to covid-19: Secondary | ICD-10-CM | POA: Insufficient documentation

## 2019-12-30 LAB — SARS CORONAVIRUS 2 (TAT 6-24 HRS): SARS Coronavirus 2: NEGATIVE

## 2019-12-30 NOTE — Progress Notes (Signed)
Please excuse Gabriel Chapman form work 12/29/2019.    [name] RN [phone]

## 2020-01-02 ENCOUNTER — Encounter (HOSPITAL_COMMUNITY)
Admission: RE | Disposition: A | Discharge status: Home / Self Care | Payer: Self-pay | Source: Home / Self Care | Attending: Internal Medicine

## 2020-01-02 ENCOUNTER — Ambulatory Visit (HOSPITAL_COMMUNITY): Payer: 59 | Admitting: Anesthesiology

## 2020-01-02 ENCOUNTER — Other Ambulatory Visit: Payer: Self-pay

## 2020-01-02 ENCOUNTER — Ambulatory Visit (HOSPITAL_COMMUNITY)
Admission: RE | Admit: 2020-01-02 | Discharge: 2020-01-02 | Disposition: A | Discharge status: Home / Self Care | Payer: 59 | Attending: Internal Medicine | Admitting: Internal Medicine

## 2020-01-02 ENCOUNTER — Encounter (HOSPITAL_COMMUNITY): Payer: Self-pay | Admitting: Internal Medicine

## 2020-01-02 DIAGNOSIS — F329 Major depressive disorder, single episode, unspecified: Secondary | ICD-10-CM | POA: Insufficient documentation

## 2020-01-02 DIAGNOSIS — K449 Diaphragmatic hernia without obstruction or gangrene: Secondary | ICD-10-CM | POA: Insufficient documentation

## 2020-01-02 DIAGNOSIS — K228 Other specified diseases of esophagus: Secondary | ICD-10-CM | POA: Insufficient documentation

## 2020-01-02 DIAGNOSIS — R1310 Dysphagia, unspecified: Secondary | ICD-10-CM | POA: Diagnosis present

## 2020-01-02 DIAGNOSIS — K219 Gastro-esophageal reflux disease without esophagitis: Secondary | ICD-10-CM | POA: Insufficient documentation

## 2020-01-02 DIAGNOSIS — F17200 Nicotine dependence, unspecified, uncomplicated: Secondary | ICD-10-CM | POA: Insufficient documentation

## 2020-01-02 HISTORY — PX: ESOPHAGOGASTRODUODENOSCOPY (EGD) WITH PROPOFOL: SHX5813

## 2020-01-02 HISTORY — PX: BIOPSY: SHX5522

## 2020-01-02 HISTORY — PX: MALONEY DILATION: SHX5535

## 2020-01-02 LAB — RAPID URINE DRUG SCREEN, HOSP PERFORMED
Amphetamines: NOT DETECTED
Barbiturates: NOT DETECTED
Benzodiazepines: NOT DETECTED
Cocaine: NOT DETECTED
Opiates: NOT DETECTED
Tetrahydrocannabinol: POSITIVE — AB

## 2020-01-02 LAB — GLUCOSE, CAPILLARY: Glucose-Capillary: 88 mg/dL (ref 70–99)

## 2020-01-02 SURGERY — ESOPHAGOGASTRODUODENOSCOPY (EGD) WITH PROPOFOL
Anesthesia: General

## 2020-01-02 MED ORDER — PROPOFOL 10 MG/ML IV BOLUS
INTRAVENOUS | Status: AC
  Filled 2020-01-02: qty 20

## 2020-01-02 MED ORDER — MIDAZOLAM HCL 5 MG/5ML IJ SOLN
INTRAMUSCULAR | Status: DC | PRN
  Administered 2020-01-02: 2 mg via INTRAVENOUS

## 2020-01-02 MED ORDER — MIDAZOLAM HCL 2 MG/2ML IJ SOLN
INTRAMUSCULAR | Status: AC
  Filled 2020-01-02: qty 2

## 2020-01-02 MED ORDER — CHLORHEXIDINE GLUCONATE CLOTH 2 % EX PADS: 6.0000 | MEDICATED_PAD | Freq: Once | CUTANEOUS | Status: DC

## 2020-01-02 MED ORDER — PROPOFOL 500 MG/50ML IV EMUL
INTRAVENOUS | Status: DC | PRN
  Administered 2020-01-02: 250 ug/kg/min via INTRAVENOUS

## 2020-01-02 MED ORDER — PROPOFOL 10 MG/ML IV BOLUS
INTRAVENOUS | Status: AC
  Filled 2020-01-02: qty 40

## 2020-01-02 MED ORDER — LACTATED RINGERS IV SOLN: Freq: Once | INTRAVENOUS | Status: AC

## 2020-01-02 NOTE — Anesthesia Preprocedure Evaluation (Signed)
Anesthesia Evaluation  Patient identified by MRN, date of birth, ID band Patient awake    Reviewed: Allergy & Precautions, NPO status , Patient's Chart, lab work & pertinent test results  Airway Mallampati: II  TM Distance: >3 FB Neck ROM: Full    Dental  (+) Chipped, Dental Advisory Given   Pulmonary Current Smoker and Patient abstained from smoking.,    Pulmonary exam normal breath sounds clear to auscultation       Cardiovascular Exercise Tolerance: Good + Valvular Problems/Murmurs  Rhythm:Regular Rate:Normal     Neuro/Psych PSYCHIATRIC DISORDERS Depression negative neurological ROS     GI/Hepatic Neg liver ROS, GERD  Medicated and Controlled,  Endo/Other  negative endocrine ROS  Renal/GU negative Renal ROS  negative genitourinary   Musculoskeletal negative musculoskeletal ROS (+)   Abdominal   Peds negative pediatric ROS (+)  Hematology negative hematology ROS (+)   Anesthesia Other Findings   Reproductive/Obstetrics negative OB ROS                            Anesthesia Physical Anesthesia Plan  ASA: II  Anesthesia Plan: General   Post-op Pain Management:    Induction: Intravenous  PONV Risk Score and Plan:   Airway Management Planned: Nasal Cannula and Natural Airway  Additional Equipment:   Intra-op Plan:   Post-operative Plan:   Informed Consent: I have reviewed the patients History and Physical, chart, labs and discussed the procedure including the risks, benefits and alternatives for the proposed anesthesia with the patient or authorized representative who has indicated his/her understanding and acceptance.     Dental advisory given  Plan Discussed with: CRNA  Anesthesia Plan Comments:        Anesthesia Quick Evaluation

## 2020-01-02 NOTE — Op Note (Signed)
[facility] Patient Name: Gabriel Chapman Procedure Date: 01/02/2020 10:35 AM MRN: [id number] Date of Birth: [birth year] Attending MD: Gabriel Chapman , MD CSN: [id number] Age: 24 Admit Type: Outpatient Procedure:                Upper GI endoscopy Indications:              Dysphagia Providers:                Gabriel Richards, MD, Gabriel Chapman. Gabriel Seller, RN,                            Gabriel Chapman, Technician Referring MD:              Medicines:                Propofol per Anesthesia Complications:            No immediate complications. Estimated Blood Loss:     Estimated blood loss was minimal. Procedure:                Pre-Anesthesia Assessment:                           - Prior to the procedure, a History and Physical                            was performed, and patient medications and                            allergies were reviewed. The patient's tolerance of                            previous anesthesia was also reviewed. The risks                            and benefits of the procedure and the sedation                            options and risks were discussed with the patient.                            All questions were answered, and informed consent                            was obtained. Prior Anticoagulants: The patient has                            taken no previous anticoagulant or antiplatelet                            agents. ASA Grade Assessment: II - A patient with                            mild systemic disease. After reviewing the risks  and benefits, the patient was deemed in                            satisfactory condition to undergo the procedure.                           After obtaining informed consent, the endoscope was                            passed under direct vision. Throughout the                            procedure, the patient's blood pressure, pulse, and                            oxygen saturations  were monitored continuously. The                            [id number]-[id number] ([id number]) scope was introduced through the                            mouth, and advanced to the second part of duodenum.                            The upper GI endoscopy was accomplished without                            difficulty. The patient tolerated the procedure                            well. Scope In: 10:51:08 AM Scope Out: 11:03:21 AM Total Procedure Duration: 0 hours 12 minutes 13 seconds  Findings:      Salmon-colored mucosa was present. (3) 2 cm "tongues of salmon-colored       epithelium coming up from the GE junction. The tips of the tongues had       somewhat of a prominent vascular pattern suspicious for Barrett's       esophagus. There was no esophagitis. The tubular esophagus appeared       patent throughout its course. . This was biopsied with a cold forceps       for histology. Estimated blood loss was minimal.      A small hiatal hernia was present.      The exam was otherwise without abnormality.      The duodenal bulb and second portion of the duodenum were normal. The       scope was withdrawn; dilation was performed with a Maloney dilator with       mild resistance at 54 Fr. The dilation site was examined following       endoscope reinsertion and showed no change. Estimated blood loss: none.       Finally biopsies of the salmon-colored epithelium were taken and       distal/mid body esophagus were taken?"submitted separately. Impression:               - Salmon-colored mucosa. Dilated. Biopsied.                           -  Small hiatal hernia.                           - The examination was otherwise normal.                           - Normal duodenal bulb and second portion of the                            duodenum. Moderate Sedation:      Moderate (conscious) sedation was personally administered by an       anesthesia professional. The following parameters were monitored: oxygen        saturation, heart rate, blood pressure, respiratory rate, EKG, adequacy       of pulmonary ventilation, and response to care. Recommendation:           - Patient has a contact number available for                            emergencies. The signs and symptoms of potential                            delayed complications were discussed with the                            patient. Return to normal activities tomorrow.                            Written discharge instructions were provided to the                            patient.                           - Advance diet as tolerated. Take Protonix 40 mg                            twice daily every day without fail. Stop alcohol                            and illicit drug use. Follow-up pathology. Office                            visit with [facility] in 6 weeks. Procedure Code(s):        --- Professional ---                           [phone], Esophagogastroduodenoscopy, flexible,                            transoral; with biopsy, single or multiple                           43450, Dilation of esophagus, by unguided sound or  bougie, single or multiple passes Diagnosis Code(s):        --- Professional ---                           K22.8, Other specified diseases of esophagus                           K44.9, Diaphragmatic hernia without obstruction or                            gangrene                           R13.10, Dysphagia, unspecified CPT copyright 2019 American Medical Association. All rights reserved. The codes documented in this report are preliminary and upon coder review may  be revised to meet current compliance requirements. Gabriel Chapman. Gabriel Krack, MD Gabriel Pac, MD 01/02/2020 11:16:54 AM This report has been signed electronically. Number of Addenda: 0

## 2020-01-02 NOTE — Anesthesia Postprocedure Evaluation (Signed)
Anesthesia Post Note  Patient: Gabriel Chapman  Procedure(s) Performed: ESOPHAGOGASTRODUODENOSCOPY (EGD) WITH PROPOFOL (N/A ) MALONEY DILATION (N/A ) BIOPSY  Patient location during evaluation: PACU Anesthesia Type: MAC Level of consciousness: awake, oriented and awake and alert Pain management: pain level controlled Vital Signs Assessment: post-procedure vital signs reviewed and stable Respiratory status: spontaneous breathing, respiratory function stable, nonlabored ventilation and patient connected to face mask oxygen Cardiovascular status: stable Postop Assessment: no apparent nausea or vomiting Anesthetic complications: no     Last Vitals:  Vitals:   01/02/20 0952 01/02/20 1110  BP: 126/75   Pulse: 65   Resp: 20   Temp: (!) 36.3 C (P) 36.6 C  SpO2: 98%     Last Pain:  Vitals:   01/02/20 1110  TempSrc:   PainSc: (P) 0-No pain                 Madonna Flegal

## 2020-01-02 NOTE — Discharge Instructions (Signed)
EGD Discharge instructions Please read the instructions outlined below and refer to this sheet in the next few weeks. These discharge instructions provide you with general information on caring for yourself after you leave the hospital. Your doctor may also give you specific instructions. While your treatment has been planned according to the most current medical practices available, unavoidable complications occasionally occur. If you have any problems or questions after discharge, please call your doctor. ACTIVITY  You may resume your regular activity but move at a slower pace for the next 24 hours.   Take frequent rest periods for the next 24 hours.   Walking will help expel (get rid of) the air and reduce the bloated feeling in your abdomen.   No driving for 24 hours (because of the anesthesia (medicine) used during the test).   You may shower.   Do not sign any important legal documents or operate any machinery for 24 hours (because of the anesthesia used during the test).  NUTRITION  Drink plenty of fluids.   You may resume your normal diet.   Begin with a light meal and progress to your normal diet.   Avoid alcoholic beverages for 24 hours or as instructed by your caregiver.  MEDICATIONS  You may resume your normal medications unless your caregiver tells you otherwise.  WHAT YOU CAN EXPECT TODAY  You may experience abdominal discomfort such as a feeling of fullness or "gas" pains.  FOLLOW-UP  Your doctor will discuss the results of your test with you.  SEEK IMMEDIATE MEDICAL ATTENTION IF ANY OF THE FOLLOWING OCCUR:  Excessive nausea (feeling sick to your stomach) and/or vomiting.   Severe abdominal pain and distention (swelling).   Trouble swallowing.   Temperature over 101 F (37.8 C).   Rectal bleeding or vomiting of blood.    GERD information provided  Please take Protonix 40 mg twice daily without fail.  Avoid alcohol and illicit substances  Office  visit with [place] in 6 weeks  Further recommendations to follow pending review of pathology report   At patient request, I called Jearld Adjutant at [phone] and reviewed results     Gastroesophageal Reflux Disease, Adult Gastroesophageal reflux (GER) happens when acid from the stomach flows up into the tube that connects the mouth and the stomach (esophagus). Normally, food travels down the esophagus and stays in the stomach to be digested. With GER, food and stomach acid sometimes move back up into the esophagus. You may have a disease called gastroesophageal reflux disease (GERD) if the reflux:  Happens often.  Causes frequent or very bad symptoms.  Causes problems such as damage to the esophagus. When this happens, the esophagus becomes sore and swollen (inflamed). Over time, GERD can make small holes (ulcers) in the lining of the esophagus. What are the causes? This condition is caused by a problem with the muscle between the esophagus and the stomach. When this muscle is weak or not normal, it does not close properly to keep food and acid from coming back up from the stomach. The muscle can be weak because of:  Tobacco use.  Pregnancy.  Having a certain type of hernia (hiatal hernia).  Alcohol use.  Certain foods and drinks, such as coffee, chocolate, onions, and peppermint. What increases the risk? You are more likely to develop this condition if you:  Are overweight.  Have a disease that affects your connective tissue.  Use NSAID medicines. What are the signs or symptoms? Symptoms of this condition include:  Heartburn.  Difficult or painful swallowing.  The feeling of having a lump in the throat.  A bitter taste in the mouth.  Bad breath.  Having a lot of saliva.  Having an upset or bloated stomach.  Belching.  Chest pain. Different conditions can cause chest pain. Make sure you see your doctor if you have chest pain.  Shortness of breath or noisy  breathing (wheezing).  Ongoing (chronic) cough or a cough at night.  Wearing away of the surface of teeth (tooth enamel).  Weight loss. How is this treated? Treatment will depend on how bad your symptoms are. Your doctor may suggest:  Changes to your diet.  Medicine.  Surgery. Follow these instructions at home: Eating and drinking   Follow a diet as told by your doctor. You may need to avoid foods and drinks such as: ? Coffee and tea (with or without caffeine). ? Drinks that contain alcohol. ? Energy drinks and sports drinks. ? Bubbly (carbonated) drinks or sodas. ? Chocolate and cocoa. ? Peppermint and mint flavorings. ? Garlic and onions. ? Horseradish. ? Spicy and acidic foods. These include peppers, chili powder, curry powder, vinegar, hot sauces, and BBQ sauce. ? Citrus fruit juices and citrus fruits, such as oranges, lemons, and limes. ? Tomato-based foods. These include red sauce, chili, salsa, and pizza with red sauce. ? Fried and fatty foods. These include donuts, french fries, potato chips, and high-fat dressings. ? High-fat meats. These include hot dogs, rib eye steak, sausage, ham, and bacon. ? High-fat dairy items, such as whole milk, butter, and cream cheese.  Eat small meals often. Avoid eating large meals.  Avoid drinking large amounts of liquid with your meals.  Avoid eating meals during the 2-3 hours before bedtime.  Avoid lying down right after you eat.  Do not exercise right after you eat. Lifestyle   Do not use any products that contain nicotine or tobacco. These include cigarettes, e-cigarettes, and chewing tobacco. If you need help quitting, ask your doctor.  Try to lower your stress. If you need help doing this, ask your doctor.  If you are overweight, lose an amount of weight that is healthy for you. Ask your doctor about a safe weight loss goal. General instructions  Pay attention to any changes in your symptoms.  Take over-the-counter  and prescription medicines only as told by your doctor. Do not take aspirin, ibuprofen, or other NSAIDs unless your doctor says it is okay.  Wear loose clothes. Do not wear anything tight around your waist.  Raise (elevate) the head of your bed about 6 inches (15 cm).  Avoid bending over if this makes your symptoms worse.  Keep all follow-up visits as told by your doctor. This is important. Contact a doctor if:  You have new symptoms.  You lose weight and you do not know why.  You have trouble swallowing or it hurts to swallow.  You have wheezing or a cough that keeps happening.  Your symptoms do not get better with treatment.  You have a hoarse voice. Get help right away if:  You have pain in your arms, neck, jaw, teeth, or back.  You feel sweaty, dizzy, or light-headed.  You have chest pain or shortness of breath.  You throw up (vomit) and your throw-up looks like blood or coffee grounds.  You pass out (faint).  Your poop (stool) is bloody or black.  You cannot swallow, drink, or eat. Summary  If a person has gastroesophageal reflux  disease (GERD), food and stomach acid move back up into the esophagus and cause symptoms or problems such as damage to the esophagus.  Treatment will depend on how bad your symptoms are.  Follow a diet as told by your doctor.  Take all medicines only as told by your doctor. This information is not intended to replace advice given to you by your health care provider. Make sure you discuss any questions you have with your health care provider. Document Revised: 05/19/2018 Document Reviewed: 05/19/2018 Elsevier Patient Education  2020 [place] After These instructions provide you with information about caring for yourself after your procedure. Your health care provider may also give you more specific instructions. Your treatment has been planned according to current medical practices, but  problems sometimes occur. Call your health care provider if you have any problems or questions after your procedure. What can I expect after the procedure? After your procedure, you may:  Feel sleepy for several hours.  Feel clumsy and have poor balance for several hours.  Feel forgetful about what happened after the procedure.  Have poor judgment for several hours.  Feel nauseous or vomit.  Have a sore throat if you had a breathing tube during the procedure. Follow these instructions at home: For at least 24 hours after the procedure:      Have a responsible adult stay with you. It is important to have someone help care for you until you are awake and alert.  Rest as needed.  Do not: ? Participate in activities in which you could fall or become injured. ? Drive. ? Use heavy machinery. ? Drink alcohol. ? Take sleeping pills or medicines that cause drowsiness. ? Make important decisions or sign legal documents. ? Take care of children on your own. Eating and drinking  Follow the diet that is recommended by your health care provider.  If you vomit, drink water, juice, or soup when you can drink without vomiting.  Make sure you have little or no nausea before eating solid foods. General instructions  Take over-the-counter and prescription medicines only as told by your health care provider.  If you have sleep apnea, surgery and certain medicines can increase your risk for breathing problems. Follow instructions from your health care provider about wearing your sleep device: ? Anytime you are sleeping, including during daytime naps. ? While taking prescription pain medicines, sleeping medicines, or medicines that make you drowsy.  If you smoke, do not smoke without supervision.  Keep all follow-up visits as told by your health care provider. This is important. Contact a health care provider if:  You keep feeling nauseous or you keep vomiting.  You feel  light-headed.  You develop a rash.  You have a fever. Get help right away if:  You have trouble breathing. Summary  For several hours after your procedure, you may feel sleepy and have poor judgment.  Have a responsible adult stay with you for at least 24 hours or until you are awake and alert. This information is not intended to replace advice given to you by your health care provider. Make sure you discuss any questions you have with your health care provider. Document Revised: 02/08/2018 Document Reviewed: 03/02/2016 Elsevier Patient Education  [place].

## 2020-01-02 NOTE — Progress Notes (Signed)
Please excuse Gabriel Chapman from work today 01-02-2020 through 01-03-2020 at 12noon. He cannot drive, operate heavy machinery, or sign legal documents until after 12:00 Noon on Tuesday January 03, 2020.

## 2020-01-02 NOTE — Interval H&P Note (Signed)
History and Physical Interval Note:  01/02/2020 10:34 AM  Gabriel Chapman  has presented today for surgery, with the diagnosis of dysphagia, hematemesis, epigastric pain.  The various methods of treatment have been discussed with the patient and family. After consideration of risks, benefits and other options for treatment, the patient has consented to  Procedure(s) with comments: ESOPHAGOGASTRODUODENOSCOPY (EGD) WITH PROPOFOL (N/A) - 10:45am MALONEY DILATION (N/A) as a surgical intervention.  The patient's history has been reviewed, patient examined, no change in status, stable for surgery.  I have reviewed the patient's chart and labs.  Questions were answered to the patient's satisfaction.     Gabriel Chapman   Dyspepsia, GERD and prominent esophageal dysphagia symptoms.  No change.  EGD with possible esophageal dilation as feasible/appropriate per plan.  The risks, benefits, limitations, alternatives and imponderables have been reviewed with the patient. Potential for esophageal dilation, biopsy, etc. have also been reviewed.  Questions have been answered. All parties agreeable.

## 2020-01-02 NOTE — Transfer of Care (Signed)
Immediate Anesthesia Transfer of Care Note  Patient: Gabriel Chapman  Procedure(s) Performed: ESOPHAGOGASTRODUODENOSCOPY (EGD) WITH PROPOFOL (N/A ) MALONEY DILATION (N/A ) BIOPSY  Patient Location: PACU  Anesthesia Type:MAC  Level of Consciousness: awake, alert , oriented and patient cooperative  Airway & Oxygen Therapy: Patient Spontanous Breathing and Patient connected to face mask oxygen  Post-op Assessment: Report given to RN, Post -op Vital signs reviewed and stable and Patient moving all extremities X 4  Post vital signs: Reviewed and stable  Last Vitals:  Vitals Value Taken Time  BP 108/69 01/02/20 1110  Temp    Pulse 79 01/02/20 1111  Resp 23 01/02/20 1111  SpO2 92 % 01/02/20 1111  Vitals shown include unvalidated device data.  Last Pain:  Vitals:   01/02/20 1046  TempSrc:   PainSc: 0-No pain         Complications: No apparent anesthesia complications

## 2020-01-03 ENCOUNTER — Encounter: Payer: Self-pay | Admitting: Internal Medicine

## 2020-01-03 LAB — SURGICAL PATHOLOGY

## 2020-02-14 ENCOUNTER — Encounter: Payer: Self-pay | Admitting: Gastroenterology

## 2020-02-14 NOTE — Progress Notes (Deleted)
Referring Provider: No ref. provider found Primary Care Physician:  Patient, No Pcp Per Primary GI Physician: Dr. Jena Gauss  No chief complaint on file.   HPI:   Gabriel Chapman is a 24 y.o. male presenting today for follow-up. Last seen in our office for initial consult at the request of [facility] for uncontrolled GERD, nausea with occasional hematemesis, epigastric abdominal pain worsened with with alcohol, worsening solid food dysphagia present for 5 years, and occasional diarrhea associated with certain food triggers. Admitted to occaional ibuprofen or BC powder use. Had decreased alcohol intake from 18 pack of beer and 1 pink liquor daily to 6 beer every 2-3 weekends. Concern for alcohol or NSAID induced gastritis, esophagitis, duodenitis.  Very possible he has PUD.   Hematemesis could also be secondary to Mallory-Weiss tear. Suspected diarrhea was likely secondary to food intolerances. Plans for EGD +/- dilation, CBC, CMP, celiac serologies, and TSH, start Protonix BID, zofran prn, GERD diet, avoid NSAIDs, work towards alcohol abstinence, and follow lactose free diet or take lactaid pills prior to dairy.    Labs completed 12/19/2019.  CBC and CMP essentially normal.  Thyroid function within normal limits.  No evidence of celiac disease.  EGD 01/02/20 with salmon colored mucosa in the GE junction s/p biopsied, esophagus dilated, small hiatal hernia, exam otherwise normal. Recommendations to continue current medications, stop alcohol and ilicit drug use, and f/u in 6 weeks. GE junction biopsy with reactive/regenerative changes. 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   Today:   GERD:  Epigastric Pain:   Diarrhea:  Past Medical History:  Diagnosis Date  . Heart murmur   . History of back surgery    3 years     Past Surgical History:  Procedure Laterality Date  . BACK SURGERY    . BIOPSY  01/02/2020   Procedure: BIOPSY;   Surgeon: Corbin Ade, MD;  Location: [facility];  Service: Endoscopy;;  esophagus  . ESOPHAGOGASTRODUODENOSCOPY (EGD) WITH PROPOFOL N/A 01/02/2020   Procedure: ESOPHAGOGASTRODUODENOSCOPY (EGD) WITH PROPOFOL;  Surgeon: Corbin Ade, MD;  Location: [facility];  Service: Endoscopy;  Laterality: N/A;  10:45am  . MALONEY DILATION N/A 01/02/2020   Procedure: Elease Hashimoto DILATION;  Surgeon: Corbin Ade, MD;  Location: [facility];  Service: Endoscopy;  Laterality: N/A;    Current Outpatient Medications  Medication Sig Dispense Refill  . cyclobenzaprine (FLEXERIL) 10 MG tablet Take 10 mg by mouth 3 (three) times daily as needed for muscle spasms.     . ondansetron (ZOFRAN) 4 MG tablet Take 1 tablet (4 mg total) by mouth every 8 (eight) hours as needed for nausea or vomiting. 30 tablet 1  . pantoprazole (PROTONIX) 40 MG tablet Take 1 tablet (40 mg total) by mouth 2 (two) times daily before a meal. 60 tablet 5   No current facility-administered medications for this visit.    Allergies as of 02/15/2020  . (No Known Allergies)    Family History  Problem Relation Age of Onset  . Prostate cancer Paternal Grandfather   . Colon cancer Neg Hx     Social History   Socioeconomic History  . Marital status: Single    Spouse name: Not on file  . Number of children: Not on file  . Years of education: Not on file  . Highest education level: Not on file  Occupational History  . Not on file  Tobacco Use  . Smoking status:  Current Every Day Smoker    Packs/day: 0.25    Types: Cigarettes  . Smokeless tobacco: Current User    Types: Chew  Substance and Sexual Activity  . Alcohol use: Yes    Alcohol/week: 5.0 - 6.0 standard drinks    Types: 5 - 6 Cans of beer per week    Comment: "has drank for past 3 days" but "normally just on weekends" ; 12/19/19 occ beer  . Drug use: Not Currently    Types: Marijuana, Cocaine    Comment: Last used drugs 1-2 years ago (12/19/19)  . Sexual activity:  Not on file  Other Topics Concern  . Not on file  Social History Narrative  . Not on file   Social Determinants of Health   Financial Resource Strain:   . Difficulty of Paying Living Expenses:   Food Insecurity:   . Worried About Charity fundraiser in the Last Year:   . Arboriculturist in the Last Year:   Transportation Needs:   . Film/video editor (Medical):   Marland Kitchen Lack of Transportation (Non-Medical):   Physical Activity:   . Days of Exercise per Week:   . Minutes of Exercise per Session:   Stress:   . Feeling of Stress :   Social Connections:   . Frequency of Communication with Friends and Family:   . Frequency of Social Gatherings with Friends and Family:   . Attends Religious Services:   . Active Member of Clubs or Organizations:   . Attends Archivist Meetings:   Marland Kitchen Marital Status:     Review of Systems: Gen: Denies fever, chills, anorexia. Denies fatigue, weakness, weight loss.  CV: Denies chest pain, palpitations, syncope, peripheral edema, and claudication. Resp: Denies dyspnea at rest, cough, wheezing, coughing up blood, and pleurisy. GI: Denies vomiting blood, jaundice, and fecal incontinence.   Denies dysphagia or odynophagia. Derm: Denies rash, itching, dry skin Psych: Denies depression, anxiety, memory loss, confusion. No homicidal or suicidal ideation.  Heme: Denies bruising, bleeding, and enlarged lymph nodes.  Physical Exam: There were no vitals taken for this visit. General:   Alert and oriented. No distress noted. Pleasant and cooperative.  Head:  Normocephalic and atraumatic. Eyes:  Conjuctiva clear without scleral icterus. Mouth:  Oral mucosa pink and moist. Good dentition. No lesions. Heart:  S1, S2 present without murmurs appreciated. Lungs:  Clear to auscultation bilaterally. No wheezes, rales, or rhonchi. No distress.  Abdomen:  +BS, soft, non-tender and non-distended. No rebound or guarding. No HSM or masses noted. Msk:   Symmetrical without gross deformities. Normal posture. Extremities:  Without edema. Neurologic:  Alert and  oriented x4 Psych:  Alert and cooperative. Normal mood and affect.

## 2020-02-15 ENCOUNTER — Encounter: Payer: Self-pay | Admitting: Internal Medicine

## 2020-02-15 ENCOUNTER — Telehealth: Payer: Self-pay | Admitting: Internal Medicine

## 2020-02-15 ENCOUNTER — Ambulatory Visit: Payer: 59 | Admitting: Gastroenterology

## 2020-02-15 NOTE — Telephone Encounter (Signed)
Noted  

## 2020-02-15 NOTE — Telephone Encounter (Signed)
PATIENT WAS A NO SHOW AND LETTER SENT  °

## 2022-04-20 ENCOUNTER — Emergency Department (HOSPITAL_COMMUNITY): Payer: 59

## 2022-04-20 ENCOUNTER — Other Ambulatory Visit: Payer: Self-pay

## 2022-04-20 ENCOUNTER — Emergency Department (HOSPITAL_COMMUNITY)
Admission: EM | Admit: 2022-04-20 | Discharge: 2022-04-20 | Disposition: A | Discharge status: Home / Self Care | Payer: 59 | Attending: Emergency Medicine | Admitting: Emergency Medicine

## 2022-04-20 ENCOUNTER — Encounter (HOSPITAL_COMMUNITY): Payer: Self-pay

## 2022-04-20 DIAGNOSIS — W228XXA Striking against or struck by other objects, initial encounter: Secondary | ICD-10-CM | POA: Diagnosis not present

## 2022-04-20 DIAGNOSIS — S62141A Displaced fracture of body of hamate [unciform] bone, right wrist, initial encounter for closed fracture: Secondary | ICD-10-CM | POA: Diagnosis not present

## 2022-04-20 DIAGNOSIS — S6991XA Unspecified injury of right wrist, hand and finger(s), initial encounter: Secondary | ICD-10-CM | POA: Diagnosis present

## 2022-04-20 DIAGNOSIS — F17210 Nicotine dependence, cigarettes, uncomplicated: Secondary | ICD-10-CM | POA: Insufficient documentation

## 2022-04-20 MED ORDER — ACETAMINOPHEN 500 MG PO TABS
1000.0000 mg | ORAL_TABLET | Freq: Once | ORAL | Status: AC
  Administered 2022-04-20: 1000 mg via ORAL
  Filled 2022-04-20: qty 2

## 2022-04-20 MED ORDER — NAPROXEN 250 MG PO TABS
500.0000 mg | ORAL_TABLET | Freq: Once | ORAL | Status: AC
  Administered 2022-04-20: 500 mg via ORAL
  Filled 2022-04-20: qty 2

## 2022-04-20 MED ORDER — OXYCODONE HCL 5 MG PO TABS: 5.0000 mg | ORAL_TABLET | ORAL | 0 refills | Status: DC | PRN

## 2022-04-20 MED ORDER — HYDROMORPHONE HCL 1 MG/ML IJ SOLN
1.0000 mg | Freq: Once | INTRAMUSCULAR | Status: AC
  Administered 2022-04-20: 1 mg via INTRAVENOUS
  Filled 2022-04-20: qty 1

## 2022-04-20 MED ORDER — PROPOFOL 10 MG/ML IV BOLUS: 1.0000 mg/kg | Freq: Once | INTRAVENOUS | Status: DC

## 2022-04-20 MED ORDER — OXYCODONE HCL 5 MG PO TABS
5.0000 mg | ORAL_TABLET | Freq: Once | ORAL | Status: AC
  Administered 2022-04-20: 5 mg via ORAL
  Filled 2022-04-20: qty 1

## 2022-04-20 MED ORDER — PROPOFOL 10 MG/ML IV BOLUS
1.0000 mg/kg | Freq: Once | INTRAVENOUS | Status: AC
  Administered 2022-04-20: 104.3 mg via INTRAVENOUS
  Filled 2022-04-20: qty 20

## 2022-04-20 NOTE — ED Triage Notes (Signed)
Pt reports right hand pain after punching wall. Edema present

## 2022-04-20 NOTE — Progress Notes (Signed)
At patient bedside for conscious sedation to reset right wrist.  Patient came through procedure well.  Patient is currently more alert and talking.  RN still at bedside with patient.

## 2022-04-20 NOTE — Discharge Instructions (Addendum)
You were evaluated in the Emergency Department and after careful evaluation, we did not find any emergent condition requiring admission or further testing in the hospital.  You have a broken bone in your wrist and a dislocated bone in your hand.  You will need surgery.  We discussed her injury with Dr. Fredna Dow, who is a hand specialist in [place].  He would like to see you in his office Tuesday morning and potentially do surgery Tuesday afternoon.  To have surgery on Tuesday you will need to stop eating or drinking Monday night and not have anything to eat or drink Tuesday morning.  Call the office number Tuesday morning at 8:30 AM to ask for a specific office appointment time.  Please return to the Emergency Department if you experience any worsening of your condition.   Thank you for allowing [place] to be a part of your care.
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
## 2022-04-22 ENCOUNTER — Other Ambulatory Visit: Payer: Self-pay | Admitting: Orthopedic Surgery

## 2022-04-22 ENCOUNTER — Encounter (HOSPITAL_BASED_OUTPATIENT_CLINIC_OR_DEPARTMENT_OTHER)
Admission: RE | Disposition: A | Discharge status: Home / Self Care | Payer: Self-pay | Source: Ambulatory Visit | Attending: Orthopedic Surgery

## 2022-04-22 ENCOUNTER — Other Ambulatory Visit: Payer: Self-pay

## 2022-04-22 ENCOUNTER — Ambulatory Visit (HOSPITAL_BASED_OUTPATIENT_CLINIC_OR_DEPARTMENT_OTHER): Payer: 59 | Admitting: Anesthesiology

## 2022-04-22 ENCOUNTER — Ambulatory Visit (HOSPITAL_BASED_OUTPATIENT_CLINIC_OR_DEPARTMENT_OTHER): Payer: 59

## 2022-04-22 ENCOUNTER — Ambulatory Visit (HOSPITAL_BASED_OUTPATIENT_CLINIC_OR_DEPARTMENT_OTHER)
Admission: RE | Admit: 2022-04-22 | Discharge: 2022-04-22 | Disposition: A | Discharge status: Home / Self Care | Payer: 59 | Source: Ambulatory Visit | Attending: Orthopedic Surgery | Admitting: Orthopedic Surgery

## 2022-04-22 ENCOUNTER — Encounter (HOSPITAL_BASED_OUTPATIENT_CLINIC_OR_DEPARTMENT_OTHER): Payer: Self-pay | Admitting: Orthopedic Surgery

## 2022-04-22 DIAGNOSIS — S62314A Displaced fracture of base of fourth metacarpal bone, right hand, initial encounter for closed fracture: Secondary | ICD-10-CM | POA: Insufficient documentation

## 2022-04-22 DIAGNOSIS — F17210 Nicotine dependence, cigarettes, uncomplicated: Secondary | ICD-10-CM | POA: Diagnosis not present

## 2022-04-22 DIAGNOSIS — S62306A Unspecified fracture of fifth metacarpal bone, right hand, initial encounter for closed fracture: Secondary | ICD-10-CM | POA: Diagnosis not present

## 2022-04-22 DIAGNOSIS — S63266A Dislocation of metacarpophalangeal joint of right little finger, initial encounter: Secondary | ICD-10-CM | POA: Insufficient documentation

## 2022-04-22 DIAGNOSIS — S62141A Displaced fracture of body of hamate [unciform] bone, right wrist, initial encounter for closed fracture: Secondary | ICD-10-CM | POA: Insufficient documentation

## 2022-04-22 DIAGNOSIS — W228XXA Striking against or struck by other objects, initial encounter: Secondary | ICD-10-CM | POA: Insufficient documentation

## 2022-04-22 HISTORY — PX: CLOSED REDUCTION METACARPAL WITH PERCUTANEOUS PINNING: SHX5613

## 2022-04-22 SURGERY — CLOSED REDUCTION, FRACTURE, METACARPAL BONE, WITH PERCUTANEOUS PINNING
Anesthesia: General | Site: Wrist | Laterality: Right

## 2022-04-22 MED ORDER — FENTANYL CITRATE (PF) 100 MCG/2ML IJ SOLN
INTRAMUSCULAR | Status: AC
  Filled 2022-04-22: qty 2

## 2022-04-22 MED ORDER — BUPIVACAINE HCL (PF) 0.25 % IJ SOLN
INTRAMUSCULAR | Status: AC
  Filled 2022-04-22: qty 30

## 2022-04-22 MED ORDER — ACETAMINOPHEN 10 MG/ML IV SOLN
1000.0000 mg | Freq: Once | INTRAVENOUS | Status: AC
  Administered 2022-04-22: 1000 mg via INTRAVENOUS

## 2022-04-22 MED ORDER — OXYCODONE HCL 5 MG PO TABS
ORAL_TABLET | ORAL | Status: AC
  Filled 2022-04-22: qty 1

## 2022-04-22 MED ORDER — PROPOFOL 10 MG/ML IV BOLUS
INTRAVENOUS | Status: AC
  Filled 2022-04-22: qty 20

## 2022-04-22 MED ORDER — BUPIVACAINE HCL (PF) 0.25 % IJ SOLN
INTRAMUSCULAR | Status: DC | PRN
  Administered 2022-04-22: 9 mL

## 2022-04-22 MED ORDER — OXYCODONE HCL 5 MG PO TABS
5.0000 mg | ORAL_TABLET | ORAL | Status: DC | PRN
  Administered 2022-04-22: 5 mg via ORAL

## 2022-04-22 MED ORDER — OXYCODONE HCL 5 MG/5ML PO SOLN: 5.0000 mg | Freq: Once | ORAL | Status: AC | PRN

## 2022-04-22 MED ORDER — ACETAMINOPHEN 10 MG/ML IV SOLN
INTRAVENOUS | Status: AC
  Filled 2022-04-22: qty 100

## 2022-04-22 MED ORDER — MIDAZOLAM HCL 5 MG/5ML IJ SOLN
INTRAMUSCULAR | Status: DC | PRN
  Administered 2022-04-22: 2 mg via INTRAVENOUS

## 2022-04-22 MED ORDER — CEFAZOLIN SODIUM-DEXTROSE 2-3 GM-%(50ML) IV SOLR
INTRAVENOUS | Status: DC | PRN
  Administered 2022-04-22: 2 g via INTRAVENOUS

## 2022-04-22 MED ORDER — MIDAZOLAM HCL 2 MG/2ML IJ SOLN
INTRAMUSCULAR | Status: AC
  Filled 2022-04-22: qty 2

## 2022-04-22 MED ORDER — LIDOCAINE HCL (CARDIAC) PF 100 MG/5ML IV SOSY
PREFILLED_SYRINGE | INTRAVENOUS | Status: DC | PRN
  Administered 2022-04-22: 60 mg via INTRAVENOUS

## 2022-04-22 MED ORDER — KETOROLAC TROMETHAMINE 30 MG/ML IJ SOLN
INTRAMUSCULAR | Status: AC
  Filled 2022-04-22: qty 1

## 2022-04-22 MED ORDER — PROPOFOL 10 MG/ML IV BOLUS
INTRAVENOUS | Status: DC | PRN
  Administered 2022-04-22: 200 mg via INTRAVENOUS

## 2022-04-22 MED ORDER — ONDANSETRON HCL 4 MG/2ML IJ SOLN: 4.0000 mg | Freq: Four times a day (QID) | INTRAMUSCULAR | Status: DC | PRN

## 2022-04-22 MED ORDER — LACTATED RINGERS IV SOLN: INTRAVENOUS | Status: DC

## 2022-04-22 MED ORDER — KETOROLAC TROMETHAMINE 30 MG/ML IJ SOLN
30.0000 mg | Freq: Once | INTRAMUSCULAR | Status: AC
  Administered 2022-04-22: 30 mg via INTRAVENOUS

## 2022-04-22 MED ORDER — ONDANSETRON HCL 4 MG/2ML IJ SOLN
INTRAMUSCULAR | Status: AC
  Filled 2022-04-22: qty 2

## 2022-04-22 MED ORDER — DEXAMETHASONE SODIUM PHOSPHATE 10 MG/ML IJ SOLN
INTRAMUSCULAR | Status: DC | PRN
  Administered 2022-04-22: 10 mg via INTRAVENOUS

## 2022-04-22 MED ORDER — LIDOCAINE 2% (20 MG/ML) 5 ML SYRINGE
INTRAMUSCULAR | Status: AC
  Filled 2022-04-22: qty 5

## 2022-04-22 MED ORDER — OXYCODONE-ACETAMINOPHEN 5-325 MG PO TABS: ORAL_TABLET | ORAL | 0 refills | Status: AC

## 2022-04-22 MED ORDER — DEXAMETHASONE SODIUM PHOSPHATE 10 MG/ML IJ SOLN
INTRAMUSCULAR | Status: AC
  Filled 2022-04-22: qty 1

## 2022-04-22 MED ORDER — FENTANYL CITRATE (PF) 100 MCG/2ML IJ SOLN
INTRAMUSCULAR | Status: DC | PRN
  Administered 2022-04-22: 100 ug via INTRAVENOUS
  Administered 2022-04-22: 50 ug via INTRAVENOUS

## 2022-04-22 MED ORDER — OXYCODONE HCL 5 MG PO TABS
5.0000 mg | ORAL_TABLET | Freq: Once | ORAL | Status: AC | PRN
  Administered 2022-04-22: 5 mg via ORAL

## 2022-04-22 MED ORDER — FENTANYL CITRATE (PF) 100 MCG/2ML IJ SOLN
25.0000 ug | INTRAMUSCULAR | Status: DC | PRN
  Administered 2022-04-22 (×3): 50 ug via INTRAVENOUS

## 2022-04-22 MED ORDER — ONDANSETRON HCL 4 MG/2ML IJ SOLN
INTRAMUSCULAR | Status: DC | PRN
  Administered 2022-04-22: 4 mg via INTRAVENOUS

## 2022-04-22 SURGICAL SUPPLY — 55 items
BLADE MINI RND TIP GREEN BEAV (BLADE) IMPLANT
BLADE SURG 15 STRL LF DISP TIS (BLADE) ×2 IMPLANT
BLADE SURG 15 STRL SS (BLADE) ×4
BNDG ELASTIC 2X5.8 VLCR STR LF (GAUZE/BANDAGES/DRESSINGS) IMPLANT
BNDG ELASTIC 3X5.8 VLCR STR LF (GAUZE/BANDAGES/DRESSINGS) ×2 IMPLANT
BNDG ESMARK 4X9 LF (GAUZE/BANDAGES/DRESSINGS) ×2 IMPLANT
BNDG GAUZE ELAST 4 BULKY (GAUZE/BANDAGES/DRESSINGS) ×2 IMPLANT
CHLORAPREP W/TINT 26 (MISCELLANEOUS) ×2 IMPLANT
CORD BIPOLAR FORCEPS 12FT (ELECTRODE) ×2 IMPLANT
COVER BACK TABLE 60X90IN (DRAPES) ×2 IMPLANT
COVER MAYO STAND STRL (DRAPES) ×2 IMPLANT
CUFF TOURN SGL QUICK 18X4 (TOURNIQUET CUFF) ×2 IMPLANT
DRAPE C-ARM 35X43 STRL (DRAPES) ×1 IMPLANT
DRAPE EXTREMITY T 121X128X90 (DISPOSABLE) ×2 IMPLANT
DRAPE OEC MINIVIEW 54X84 (DRAPES) ×2 IMPLANT
DRAPE SURG 17X23 STRL (DRAPES) ×2 IMPLANT
GAUZE SPONGE 4X4 12PLY STRL (GAUZE/BANDAGES/DRESSINGS) ×2 IMPLANT
GAUZE XEROFORM 1X8 LF (GAUZE/BANDAGES/DRESSINGS) ×2 IMPLANT
GLOVE BIO SURGEON STRL SZ7.5 (GLOVE) ×2 IMPLANT
GLOVE BIOGEL PI IND STRL 8 (GLOVE) ×1 IMPLANT
GLOVE BIOGEL PI INDICATOR 8 (GLOVE) ×1
GOWN STRL REUS W/ TWL LRG LVL3 (GOWN DISPOSABLE) ×1 IMPLANT
GOWN STRL REUS W/TWL LRG LVL3 (GOWN DISPOSABLE) ×2
GOWN STRL REUS W/TWL XL LVL3 (GOWN DISPOSABLE) ×2 IMPLANT
K-WIRE DBL .035X4 NSTRL (WIRE) ×2
K-WIRE DBL .045X4 NSTRL (WIRE) ×6
KWIRE DBL .035X4 NSTRL (WIRE) IMPLANT
KWIRE DBL .045X4 NSTRL (WIRE) IMPLANT
NDL HYPO 25X1 1.5 SAFETY (NEEDLE) IMPLANT
NEEDLE HYPO 22GX1.5 SAFETY (NEEDLE) IMPLANT
NEEDLE HYPO 25X1 1.5 SAFETY (NEEDLE) IMPLANT
NS IRRIG 1000ML POUR BTL (IV SOLUTION) ×2 IMPLANT
PACK BASIN DAY SURGERY FS (CUSTOM PROCEDURE TRAY) ×2 IMPLANT
PAD CAST 3X4 CTTN HI CHSV (CAST SUPPLIES) ×1 IMPLANT
PAD CAST 4YDX4 CTTN HI CHSV (CAST SUPPLIES) IMPLANT
PADDING CAST ABS 4INX4YD NS (CAST SUPPLIES) ×1
PADDING CAST ABS COTTON 4X4 ST (CAST SUPPLIES) ×1 IMPLANT
PADDING CAST COTTON 3X4 STRL (CAST SUPPLIES) ×2
PADDING CAST COTTON 4X4 STRL (CAST SUPPLIES)
SLEEVE SCD COMPRESS KNEE MED (STOCKING) IMPLANT
SPLINT PLASTER CAST XFAST 3X15 (CAST SUPPLIES) IMPLANT
SPLINT PLASTER CAST XFAST 4X15 (CAST SUPPLIES) IMPLANT
SPLINT PLASTER XTRA FAST SET 4 (CAST SUPPLIES)
SPLINT PLASTER XTRA FASTSET 3X (CAST SUPPLIES)
STOCKINETTE 4X48 STRL (DRAPES) ×2 IMPLANT
SUT ETHILON 3 0 PS 1 (SUTURE) IMPLANT
SUT ETHILON 4 0 PS 2 18 (SUTURE) ×2 IMPLANT
SUT MERSILENE 4 0 P 3 (SUTURE) IMPLANT
SUT VIC AB 3-0 PS1 18 (SUTURE)
SUT VIC AB 3-0 PS1 18XBRD (SUTURE) IMPLANT
SUT VICRYL 4-0 PS2 18IN ABS (SUTURE) IMPLANT
SYR BULB EAR ULCER 3OZ GRN STR (SYRINGE) ×2 IMPLANT
SYR CONTROL 10ML LL (SYRINGE) IMPLANT
TOWEL GREEN STERILE FF (TOWEL DISPOSABLE) ×4 IMPLANT
UNDERPAD 30X36 HEAVY ABSORB (UNDERPADS AND DIAPERS) ×2 IMPLANT

## 2022-04-22 NOTE — Anesthesia Procedure Notes (Signed)
Procedure Name: LMA Insertion Date/Time: 04/22/2022 1:38 PM Performed by: Tawni Millers, CRNA Pre-anesthesia Checklist: Patient identified, Emergency Drugs available, Suction available and Patient being monitored Patient Re-evaluated:Patient Re-evaluated prior to induction Oxygen Delivery Method: Circle system utilized Preoxygenation: Pre-oxygenation with 100% oxygen Induction Type: IV induction Ventilation: Mask ventilation without difficulty LMA: LMA inserted LMA Size: 4.0 Number of attempts: 1 Airway Equipment and Method: Bite block Placement Confirmation: positive ETCO2 Tube secured with: Tape Dental Injury: Teeth and Oropharynx as per pre-operative assessment
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
## 2022-04-22 NOTE — Anesthesia Preprocedure Evaluation (Signed)
Anesthesia Evaluation  Patient identified by MRN, date of birth, ID band Patient awake    Reviewed: Allergy & Precautions, H&P , NPO status , Patient's Chart, lab work & pertinent test results  Airway Mallampati: II   Neck ROM: full    Dental   Pulmonary Current Smoker,    breath sounds clear to auscultation       Cardiovascular negative cardio ROS   Rhythm:regular Rate:Normal     Neuro/Psych PSYCHIATRIC DISORDERS Depression    GI/Hepatic GERD  ,(+)     substance abuse  alcohol use and cocaine use,   Endo/Other    Renal/GU      Musculoskeletal   Abdominal   Peds  Hematology   Anesthesia Other Findings   Reproductive/Obstetrics                             Anesthesia Physical Anesthesia Plan  ASA: 2  Anesthesia Plan: General   Post-op Pain Management:    Induction: Intravenous  PONV Risk Score and Plan: 1 and Ondansetron, Dexamethasone, Midazolam and Treatment may vary due to age or medical condition  Airway Management Planned: LMA  Additional Equipment:   Intra-op Plan:   Post-operative Plan: Extubation in OR  Informed Consent: I have reviewed the patients History and Physical, chart, labs and discussed the procedure including the risks, benefits and alternatives for the proposed anesthesia with the patient or authorized representative who has indicated his/her understanding and acceptance.     Dental advisory given  Plan Discussed with: CRNA, Anesthesiologist and Surgeon  Anesthesia Plan Comments:         Anesthesia Quick Evaluation

## 2022-04-22 NOTE — Transfer of Care (Signed)
Immediate Anesthesia Transfer of Care Note  Patient: Gabriel Chapman  Procedure(s) Performed: CLOSED REDUCTION METACARPAL WITH PERCUTANEOUS PINNING RIGHT FIFTH METACARPAL (Right: Wrist)  Patient Location: PACU  Anesthesia Type:General  Level of Consciousness: sedated  Airway & Oxygen Therapy: Patient Spontanous Breathing and Patient connected to face mask oxygen  Post-op Assessment: Report given to RN and Post -op Vital signs reviewed and stable  Post vital signs: Reviewed and stable  Last Vitals:  Vitals Value Taken Time  BP 101/60 04/22/22 1422  Temp    Pulse 59 04/22/22 1423  Resp 12 04/22/22 1423  SpO2 97 % 04/22/22 1423  Vitals shown include unvalidated device data.  Last Pain:  Vitals:   04/22/22 1204  TempSrc: Oral  PainSc: 6          Complications: No notable events documented.
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
## 2022-04-22 NOTE — Discharge Instructions (Addendum)
  Post Anesthesia Home Care Instructions  Activity: Get plenty of rest for the remainder of the day. A responsible individual must stay with you for 24 hours following the procedure.  For the next 24 hours, DO NOT: -Drive a car -Paediatric nurse -Drink alcoholic beverages -Take any medication unless instructed by your physician -Make any legal decisions or sign important papers.  Meals: Start with liquid foods such as gelatin or soup. Progress to regular foods as tolerated. Avoid greasy, spicy, heavy foods. If nausea and/or vomiting occur, drink only clear liquids until the nausea and/or vomiting subsides. Call your physician if vomiting continues.  Special Instructions/Symptoms: Your throat may feel dry or sore from the anesthesia or the breathing tube placed in your throat during surgery. If this causes discomfort, gargle with warm salt water. The discomfort should disappear within 24 hours.  If you had a scopolamine patch placed behind your ear for the management of post- operative nausea and/or vomiting:  1. The medication in the patch is effective for 72 hours, after which it should be removed.  Wrap patch in a tissue and discard in the trash. Wash hands thoroughly with soap and water. 2. You may remove the patch earlier than 72 hours if you experience unpleasant side effects which may include dry mouth, dizziness or visual disturbances. 3. Avoid touching the patch. Wash your hands with soap and water after contact with the patch.    [facility] Instructions Hand Surgery  Wound Care: Keep your hand elevated above the level of your heart.  Do not allow it to dangle by your side.  Keep the dressing dry and do not remove it unless your doctor advises you to do so.  He will usually change it at the time of your post-op visit.  Moving your fingers is advised to stimulate circulation but will depend on the site of your surgery.  If you have a splint applied, your doctor will advise you  regarding movement.  Activity: Do not drive or operate machinery today.  Rest today and then you may return to your normal activity and work as indicated by your physician.  Diet:  Drink liquids today or eat a light diet.  You may resume a regular diet tomorrow.    General expectations: Pain for two to three days. Fingers may become slightly swollen.  Call your doctor if any of the following occur: Severe pain not relieved by pain medication. Elevated temperature. Dressing soaked with blood. Inability to move fingers. White or bluish color to fingers.   No tylenol or toradol until after 10pm tonight.  No percocet until 10pm tonight.

## 2022-04-23 ENCOUNTER — Encounter (HOSPITAL_BASED_OUTPATIENT_CLINIC_OR_DEPARTMENT_OTHER): Payer: Self-pay | Admitting: Orthopedic Surgery

## 2022-05-07 NOTE — Anesthesia Postprocedure Evaluation (Signed)
Anesthesia Post Note  Patient: Gabriel Chapman  Procedure(s) Performed: CLOSED REDUCTION METACARPAL WITH PERCUTANEOUS PINNING RIGHT FIFTH METACARPAL (Right: Wrist)     Patient location during evaluation: PACU Anesthesia Type: General Level of consciousness: awake and alert Pain management: pain level controlled Vital Signs Assessment: post-procedure vital signs reviewed and stable Respiratory status: spontaneous breathing, nonlabored ventilation, respiratory function stable and patient connected to nasal cannula oxygen Cardiovascular status: blood pressure returned to baseline and stable Postop Assessment: no apparent nausea or vomiting Anesthetic complications: no   No notable events documented.  Last Vitals:  Vitals:   04/22/22 1530 04/22/22 1553  BP: 122/79 130/85  Pulse: 67 67  Resp: 12 16  Temp:  (!) 36.3 C  SpO2: 96% 96%    Last Pain:  Vitals:   04/22/22 1553  TempSrc:   PainSc: 6                  Melba Araki S

## 2022-11-08 ENCOUNTER — Encounter (HOSPITAL_COMMUNITY): Payer: Self-pay

## 2022-11-08 ENCOUNTER — Emergency Department (HOSPITAL_COMMUNITY): Payer: 59

## 2022-11-08 ENCOUNTER — Other Ambulatory Visit: Payer: Self-pay

## 2022-11-08 ENCOUNTER — Emergency Department (HOSPITAL_COMMUNITY)
Admission: EM | Admit: 2022-11-08 | Discharge: 2022-11-08 | Discharge status: Against Medical Advice | Payer: 59 | Attending: Emergency Medicine | Admitting: Emergency Medicine

## 2022-11-08 DIAGNOSIS — R58 Hemorrhage, not elsewhere classified: Secondary | ICD-10-CM | POA: Diagnosis not present

## 2022-11-08 DIAGNOSIS — Z5321 Procedure and treatment not carried out due to patient leaving prior to being seen by health care provider: Secondary | ICD-10-CM | POA: Diagnosis not present

## 2022-11-08 DIAGNOSIS — M7989 Other specified soft tissue disorders: Secondary | ICD-10-CM | POA: Insufficient documentation

## 2022-11-08 NOTE — ED Notes (Addendum)
No answer in WR

## 2022-11-08 NOTE — ED Notes (Signed)
No answer in wr.
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
## 2022-11-08 NOTE — ED Notes (Signed)
No answer in WR

## 2023-03-23 ENCOUNTER — Emergency Department (HOSPITAL_COMMUNITY)
Admission: EM | Admit: 2023-03-23 | Discharge: 2023-03-23 | Disposition: A | Discharge status: Home / Self Care | Payer: 59 | Attending: Emergency Medicine | Admitting: Emergency Medicine

## 2023-03-23 ENCOUNTER — Other Ambulatory Visit: Payer: Self-pay

## 2023-03-23 ENCOUNTER — Encounter (HOSPITAL_COMMUNITY): Payer: Self-pay | Admitting: *Deleted

## 2023-03-23 ENCOUNTER — Emergency Department (HOSPITAL_COMMUNITY): Payer: 59

## 2023-03-23 DIAGNOSIS — R110 Nausea: Secondary | ICD-10-CM | POA: Diagnosis not present

## 2023-03-23 DIAGNOSIS — R109 Unspecified abdominal pain: Secondary | ICD-10-CM | POA: Diagnosis present

## 2023-03-23 LAB — BASIC METABOLIC PANEL
Anion gap: 8 (ref 5–15)
BUN: 14 mg/dL (ref 6–20)
CO2: 23 mmol/L (ref 22–32)
Calcium: 9.8 mg/dL (ref 8.9–10.3)
Chloride: 106 mmol/L (ref 98–111)
Creatinine, Ser: 0.72 mg/dL (ref 0.61–1.24)
GFR, Estimated: >60 mL/min (ref >60)
Glucose, Bld: 110 mg/dL — ABNORMAL HIGH (ref 70–99)
Potassium: 4.1 mmol/L (ref 3.5–5.1)
Sodium: 137 mmol/L (ref 135–145)

## 2023-03-23 LAB — URINALYSIS, ROUTINE W REFLEX MICROSCOPIC
Bilirubin Urine: NEGATIVE
Glucose, UA: NEGATIVE mg/dL
Hgb urine dipstick: NEGATIVE
Ketones, ur: NEGATIVE mg/dL
Leukocytes,Ua: NEGATIVE
Nitrite: NEGATIVE
Protein, ur: NEGATIVE mg/dL
Specific Gravity, Urine: 1.025 (ref 1.005–1.030)
pH: 6 (ref 5.0–8.0)

## 2023-03-23 LAB — CBC WITH DIFFERENTIAL/PLATELET
Abs Immature Granulocytes: 0.03 10*3/uL (ref 0.00–0.07)
Basophils Absolute: 0.1 10*3/uL (ref 0.0–0.1)
Basophils Relative: 1 %
Eosinophils Absolute: 0.1 10*3/uL (ref 0.0–0.5)
Eosinophils Relative: 1 %
HCT: 46.9 % (ref 39.0–52.0)
Hemoglobin: 16.7 g/dL (ref 13.0–17.0)
Immature Granulocytes: 0 %
Lymphocytes Relative: 24 %
Lymphs Abs: 2.1 10*3/uL (ref 0.7–4.0)
MCH: 33.3 pg (ref 26.0–34.0)
MCHC: 35.6 g/dL (ref 30.0–36.0)
MCV: 93.6 fL (ref 80.0–100.0)
Monocytes Absolute: 0.6 10*3/uL (ref 0.1–1.0)
Monocytes Relative: 6 %
Neutro Abs: 6.1 10*3/uL (ref 1.7–7.7)
Neutrophils Relative %: 68 %
Platelets: 235 10*3/uL (ref 150–400)
RBC: 5.01 MIL/uL (ref 4.22–5.81)
RDW: 12.6 % (ref 11.5–15.5)
WBC: 8.9 10*3/uL (ref 4.0–10.5)
nRBC: 0 % (ref 0.0–0.2)

## 2023-03-23 MED ORDER — LIDOCAINE 5 % EX PTCH
1.0000 | MEDICATED_PATCH | Freq: Once | CUTANEOUS | Status: DC
  Administered 2023-03-23: 1 via TRANSDERMAL
  Filled 2023-03-23: qty 1

## 2023-03-23 MED ORDER — ACETAMINOPHEN 500 MG PO TABS
1000.0000 mg | ORAL_TABLET | Freq: Once | ORAL | Status: AC
  Administered 2023-03-23: 1000 mg via ORAL
  Filled 2023-03-23: qty 2

## 2023-03-23 NOTE — ED Provider Triage Note (Signed)
Emergency Medicine Provider Triage Evaluation Note  HOOPER PETTEWAY , a 27 y.o. male  was evaluated in triage.  Pt complains of left-sided flank pain for the past 3 days which worsened this morning.  Patient does endorse nausea with no emesis that began yesterday.  He states he last urinated last night at approximately 10 PM.  Review of Systems  Positive: As above Negative: As above  Physical Exam  BP (!) 139/99 (BP Location: Right Arm)   Pulse 91   Temp 98.3 F (36.8 C) (Oral)   Resp 17   Ht 5\' 9"  (1.753 m)   Wt 102.1 kg   SpO2 100%   BMI 33.23 kg/m  Gen:   Awake, no distress   Resp:  Normal effort  MSK:   Moves extremities without difficulty  Other:  Left-sided CVA tenderness  Medical Decision Making  Medically screening exam initiated at 2:56 PM.  Appropriate orders placed.  FERAS GARDELLA was informed that the remainder of the evaluation will be completed by another provider, this initial triage assessment does not replace that evaluation, and the importance of remaining in the ED until their evaluation is complete.     Darrick Grinder, PA-C 03/23/23 1457

## 2023-03-23 NOTE — ED Triage Notes (Signed)
Pt with left flank pain for past 3 days. Worse today.  + nausea yesterday.  Denies any emesis.  Pt states last voided yesterday.

## 2023-03-23 NOTE — ED Provider Notes (Signed)
[facility] AT [facility] Provider Note   CSN: [id number] Arrival date & time: 03/23/23  1210     History  Chief Complaint  Patient presents with   Flank Pain    Gabriel Chapman is a 27 y.o. male.  Patient presents the emergency room complaining left-sided flank pain which is been worsening for the past 3 days.  He does endorse some brief period of nausea yesterday with no emesis.  He denies any current urinary symptoms.  Past medical history significant for back surgery, heart murmur, polysubstance dependence  HPI     Home Medications Prior to Admission medications   Medication Sig Start Date End Date Taking? Authorizing Provider  cyclobenzaprine (FLEXERIL) 10 MG tablet Take 10 mg by mouth 3 (three) times daily as needed for muscle spasms.  11/14/19   [provider]  ondansetron (ZOFRAN) 4 MG tablet Take 1 tablet (4 mg total) by mouth every 8 (eight) hours as needed for nausea or vomiting. 12/19/19   Letta Median, PA-C  oxyCODONE-acetaminophen (PERCOCET) 5-325 MG tablet 1-2 tabs PO q6 hours prn pain 04/22/22   Betha Loa, MD  pantoprazole (PROTONIX) 40 MG tablet Take 1 tablet (40 mg total) by mouth 2 (two) times daily before a meal. 12/19/19   Letta Median, PA-C  sertraline (ZOLOFT) 50 MG tablet Take 1 tablet (50 mg total) by mouth daily. For depression Patient not taking: Reported on 05/30/2019 10/30/18 08/08/19  Armandina Stammer I, NP  traZODone (DESYREL) 50 MG tablet Take 1 tablet (50 mg total) by mouth at bedtime as needed for sleep. Patient not taking: Reported on 05/30/2019 10/29/18 08/08/19  Armandina Stammer I, NP      Allergies    Patient has no known allergies.    Review of Systems   Review of Systems  Physical Exam Updated Vital Signs BP 136/82 (BP Location: Right Arm)   Pulse (!) 56   Temp 98.1 F (36.7 C) (Oral)   Resp 18   Ht 5\' 9"  (1.753 m)   Wt 102.1 kg   SpO2 100%   BMI 33.23 kg/m  Physical Exam Vitals and nursing  note reviewed.  Constitutional:      General: He is not in acute distress.    Appearance: He is well-developed.  HENT:     Head: Normocephalic and atraumatic.  Eyes:     Conjunctiva/sclera: Conjunctivae normal.  Cardiovascular:     Rate and Rhythm: Normal rate and regular rhythm.     Heart sounds: No murmur heard. Pulmonary:     Effort: Pulmonary effort is normal. No respiratory distress.     Breath sounds: Normal breath sounds.  Abdominal:     Palpations: Abdomen is soft.     Tenderness: There is no abdominal tenderness. There is left CVA tenderness.  Musculoskeletal:        General: No swelling.     Cervical back: Neck supple.  Skin:    General: Skin is warm and dry.     Capillary Refill: Capillary refill takes less than 2 seconds.  Neurological:     Mental Status: He is alert.  Psychiatric:        Mood and Affect: Mood normal.     ED Results / Procedures / Treatments   Labs (all labs ordered are listed, but only abnormal results are displayed) Labs Reviewed  URINALYSIS, ROUTINE W REFLEX MICROSCOPIC - Abnormal; Notable for the following components:      Result Value  APPearance HAZY (*)    All other components within normal limits  BASIC METABOLIC PANEL - Abnormal; Notable for the following components:   Glucose, Bld 110 (*)    All other components within normal limits  CBC WITH DIFFERENTIAL/PLATELET    EKG None  Radiology CT Renal Stone Study  Result Date: 03/23/2023 CLINICAL DATA:  Abdominal/flank pain, stone suspected. Left flank pain and nausea for 3 days. EXAM: CT ABDOMEN AND PELVIS WITHOUT CONTRAST TECHNIQUE: Multidetector CT imaging of the abdomen and pelvis was performed following the standard protocol without IV contrast. RADIATION DOSE REDUCTION: This exam was performed according to the departmental dose-optimization program which includes automated exposure control, adjustment of the mA and/or kV according to patient size and/or use of iterative  reconstruction technique. COMPARISON:  CT abdomen and pelvis 05/30/2019 FINDINGS: Lower chest: Clear lung bases. Hepatobiliary: No focal liver abnormality is seen. No gallstones, gallbladder wall thickening, or biliary dilatation. Pancreas: Unremarkable. Spleen: Unremarkable. Adrenals/Urinary Tract: Unremarkable adrenal glands. No evidence of a renal mass, calculi, or hydronephrosis. Unremarkable bladder. Stomach/Bowel: The stomach is unremarkable. Mild left-sided colonic diverticulosis is noted. There is no evidence of acute diverticulitis or bowel obstruction. The appendix is unremarkable. Vascular/Lymphatic: No enlarged lymph nodes. Normal caliber of the abdominal aorta. Reproductive: Unremarkable prostate. Other: No ascites or pneumoperitoneum. Musculoskeletal: No acute osseous abnormality or suspicious osseous lesion. Multiple Schmorl's nodes, most notable at L1-2. IMPRESSION: No acute abnormality identified in the abdomen or pelvis. Electronically Signed   By: Sebastian Ache M.D.   On: 03/23/2023 15:21    Procedures Procedures    Medications Ordered in ED Medications  lidocaine (LIDODERM) 5 % 1-3 patch (1 patch Transdermal Patch Applied 03/23/23 1907)  acetaminophen (TYLENOL) tablet 1,000 mg (1,000 mg Oral Given 03/23/23 1907)    ED Course/ Medical Decision Making/ A&P                             Medical Decision Making Amount and/or Complexity of Data Reviewed Labs: ordered.  Risk OTC drugs. Prescription drug management.   This patient presents to the ED for concern of left-sided flank pain, this involves an extensive number of treatment options, and is a complaint that carries with it a high risk of complications and morbidity.  The differential diagnosis includes nephrolithiasis, hydronephrosis, pyelonephritis, muscle strain, others   Co morbidities that complicate the patient evaluation  History of back surgery  Lab Tests:  I Ordered, and personally interpreted labs.  The  pertinent results include: Unremarkable UA, BMP, CBC   Imaging Studies ordered:  I ordered imaging studies including CT renal stone study I independently visualized and interpreted imaging which showed no acute abnormality I agree with the radiologist interpretation   Test / Admission - Considered:  No acute finding on CT renal stone study.  Lab work grossly unremarkable.  No signs of hydronephrosis, pyelonephritis, nephrolithiasis, UTI.  Patient does endorse walking up and down hills over the past few days while [place] hunting.  Question if this may be a muscular strain.  Considered giving patient anti-inflammatories but patient states that he is unable to take them as he has a history of diverticulitis.  I did order the patient Tylenol and a lidocaine patch and he was feeling somewhat better after being given the medications.  Plan to discharge home with recommendations for lidocaine patch and Tylenol.        Final Clinical Impression(s) / ED Diagnoses Final diagnoses:  Left  flank pain    Rx / DC Orders ED Discharge Orders     None         Pamala Duffel 03/23/23 1948    Pricilla Loveless, MD 03/27/23 2115

## 2023-03-23 NOTE — Discharge Instructions (Signed)
You were evaluated today for flank pain.  Please take over-the-counter medication such as Tylenol for pain control.  Please follow-up with your primary care provider for further evaluation and management.  You may call the number within the paperwork to help find a primary care provider if you do not have one at this time.

## 2024-04-02 IMAGING — DX DG WRIST COMPLETE 3+V*R*
3 series · 3 of 3 positions shown · non-contrast
Comparison: None Available.

CLINICAL DATA: Right hand pain, punched wall

EXAM:
RIGHT WRIST - COMPLETE 3+ VIEW

[wrist ap]
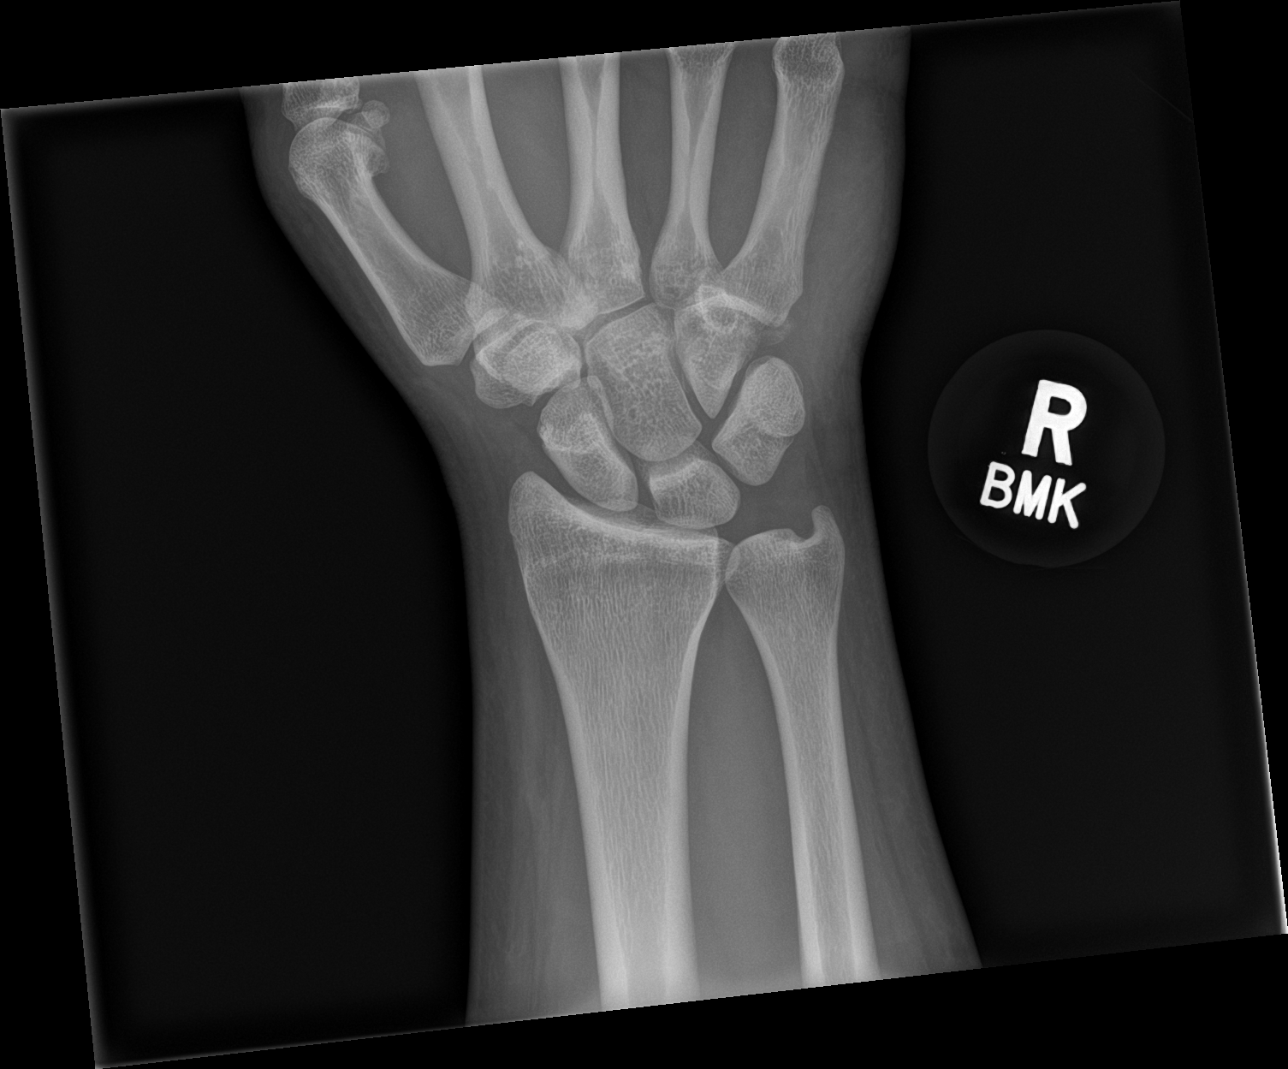

[wrist obl]
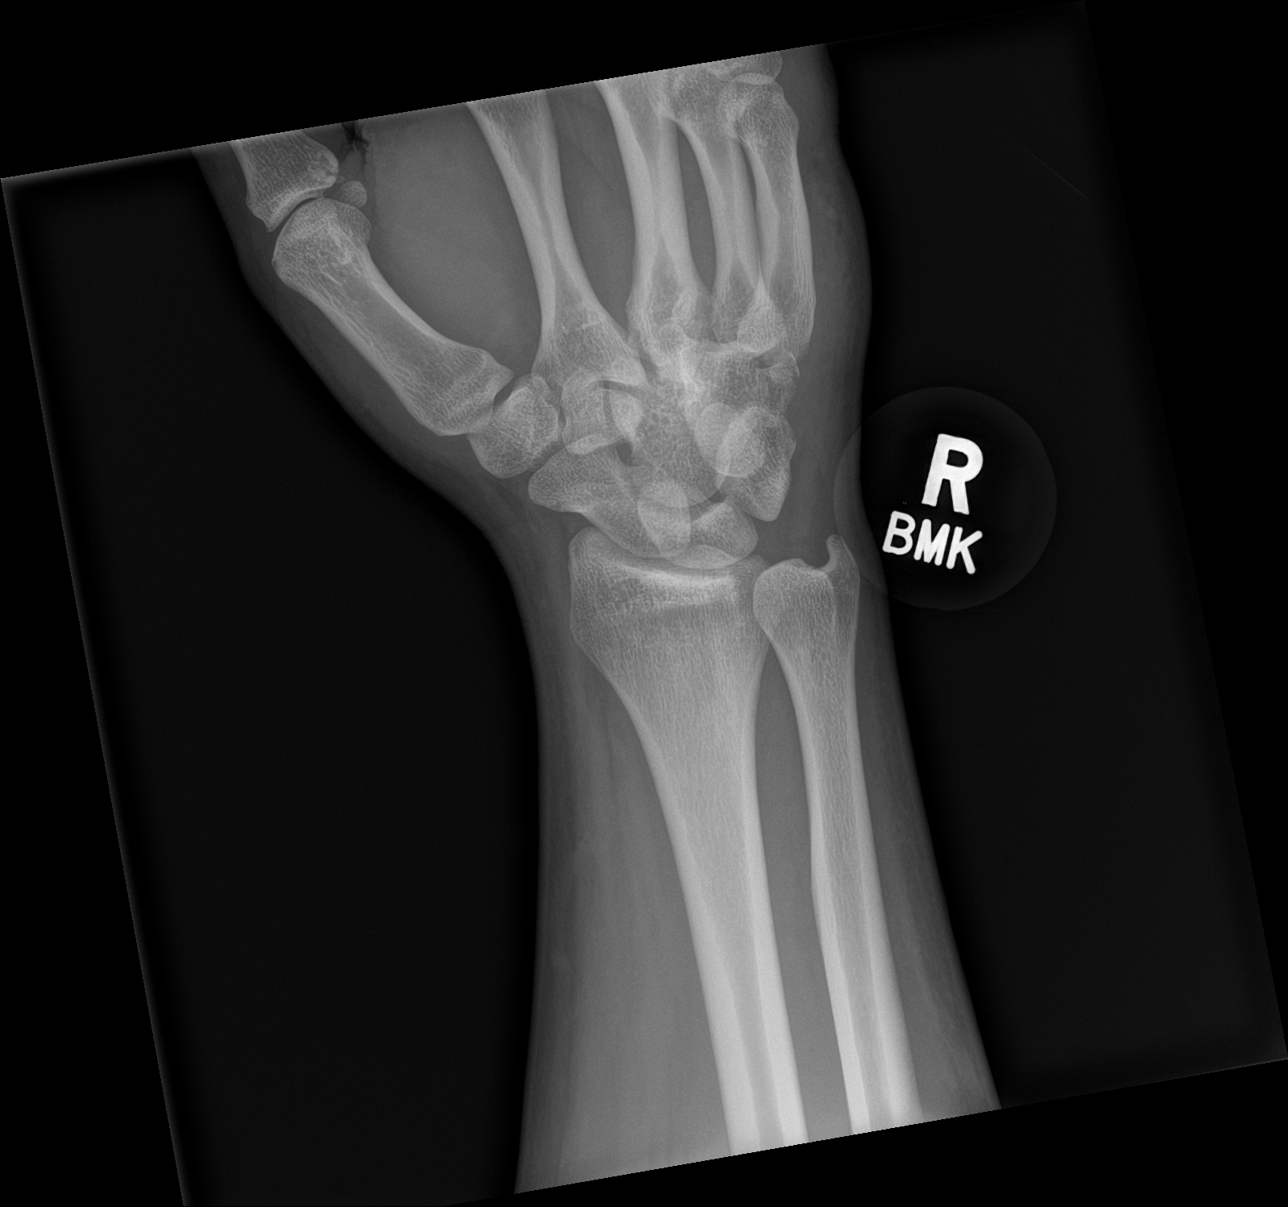

[wrist lat]
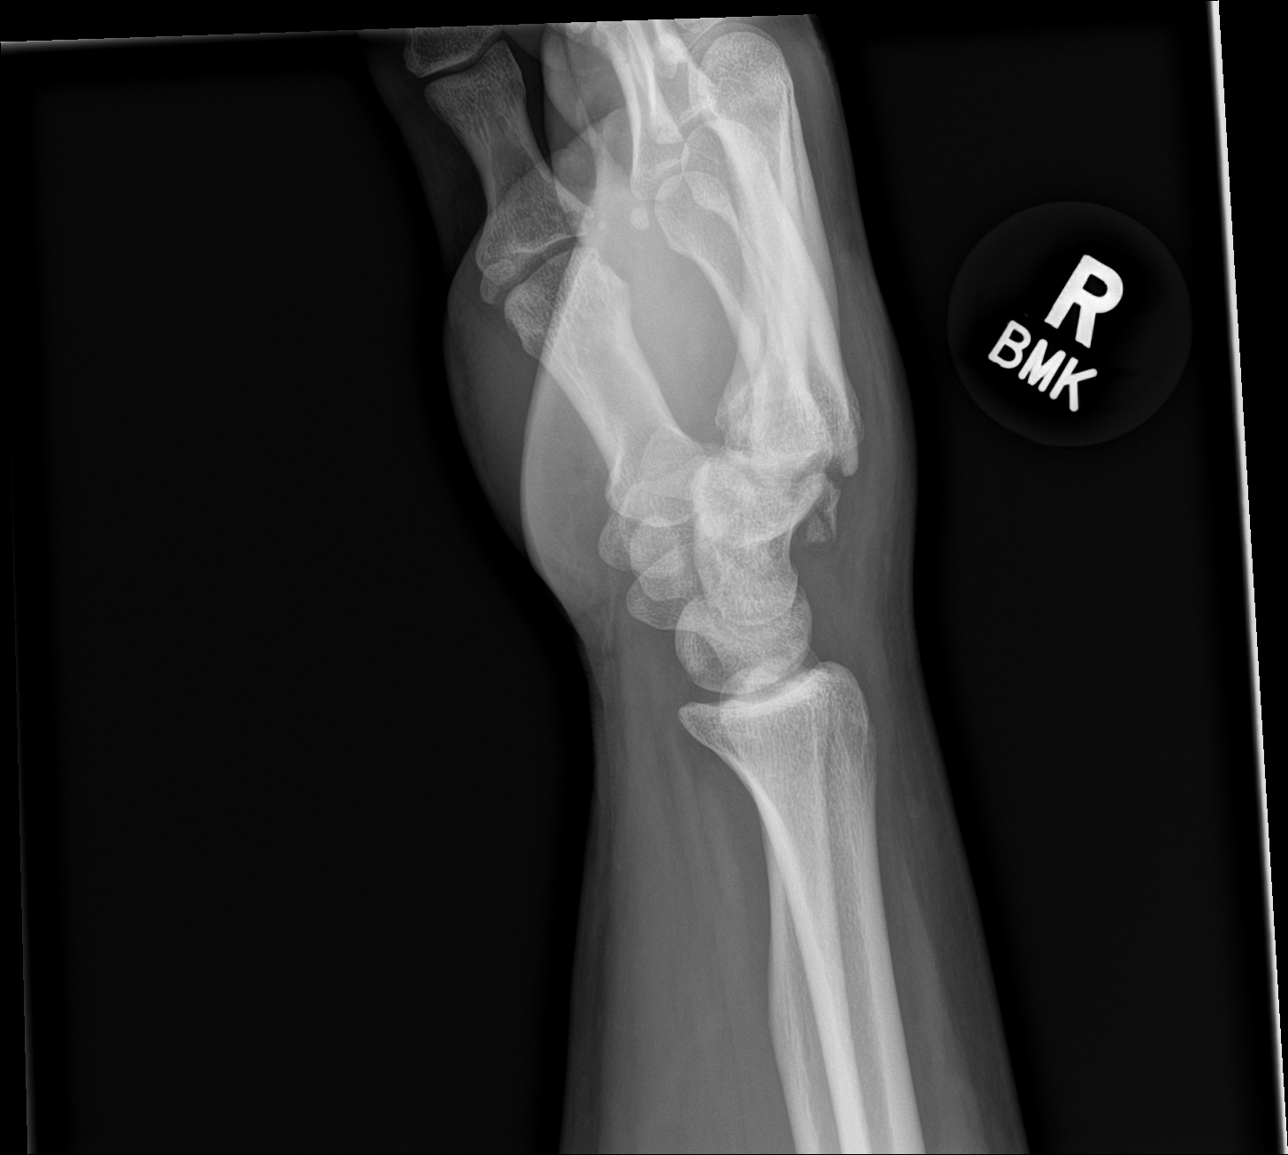

[3 of 3 positions shown; findings below may reference images not displayed]

FINDINGS: Fracture fragments along the dorsal wrist, favored to reflect a
comminuted fracture involving the body of the hamate. Overlying
dorsal soft tissue swelling.
IMPRESSION: Fracture fragments along the dorsal wrist, favored to reflect a
comminuted hamate fracture.
